# Patient Record
Sex: Female | Born: 1985 | Race: Black or African American | Hispanic: No | Marital: Single | Smoking: Current every day smoker
Health system: Southern US, Community
[De-identification: ages and names within clinical notes are randomized; demographics above are authoritative.]

## PROBLEM LIST (undated history)

## (undated) DIAGNOSIS — L0292 Furuncle, unspecified: Secondary | ICD-10-CM

## (undated) DIAGNOSIS — IMO0002 Reserved for concepts with insufficient information to code with codable children: Secondary | ICD-10-CM

## (undated) DIAGNOSIS — B20 Human immunodeficiency virus [HIV] disease: Secondary | ICD-10-CM

## (undated) DIAGNOSIS — L0293 Carbuncle, unspecified: Secondary | ICD-10-CM

## (undated) HISTORY — DX: Reserved for concepts with insufficient information to code with codable children: IMO0002

## (undated) HISTORY — DX: Carbuncle, unspecified: L02.93

## (undated) HISTORY — DX: Furuncle, unspecified: L02.92

## (undated) HISTORY — PX: ECTOPIC PREGNANCY SURGERY: SHX613

---

## 2005-01-02 ENCOUNTER — Emergency Department (HOSPITAL_COMMUNITY): Admission: EM | Admit: 2005-01-02 | Discharge: 2005-01-02 | Payer: Self-pay | Admitting: Family Medicine

## 2006-07-22 ENCOUNTER — Emergency Department (HOSPITAL_COMMUNITY): Admission: EM | Admit: 2006-07-22 | Discharge: 2006-07-22 | Payer: Self-pay | Admitting: Family Medicine

## 2007-01-29 ENCOUNTER — Emergency Department (HOSPITAL_COMMUNITY): Admission: EM | Admit: 2007-01-29 | Discharge: 2007-01-29 | Payer: Self-pay | Admitting: Family Medicine

## 2009-07-07 ENCOUNTER — Inpatient Hospital Stay (HOSPITAL_COMMUNITY): Admission: AD | Admit: 2009-07-07 | Discharge: 2009-07-07 | Payer: Self-pay | Admitting: Family Medicine

## 2009-07-07 ENCOUNTER — Emergency Department (HOSPITAL_COMMUNITY): Admission: EM | Admit: 2009-07-07 | Discharge: 2009-07-07 | Payer: Self-pay | Admitting: Emergency Medicine

## 2009-07-10 ENCOUNTER — Inpatient Hospital Stay (HOSPITAL_COMMUNITY): Admission: AD | Admit: 2009-07-10 | Discharge: 2009-07-10 | Payer: Self-pay | Admitting: Obstetrics and Gynecology

## 2009-07-13 ENCOUNTER — Inpatient Hospital Stay (HOSPITAL_COMMUNITY): Admission: AD | Admit: 2009-07-13 | Discharge: 2009-07-13 | Payer: Self-pay | Admitting: Obstetrics & Gynecology

## 2009-07-20 ENCOUNTER — Ambulatory Visit (HOSPITAL_COMMUNITY): Admission: AD | Admit: 2009-07-20 | Discharge: 2009-07-20 | Payer: Self-pay | Admitting: Obstetrics & Gynecology

## 2009-07-20 ENCOUNTER — Ambulatory Visit: Payer: Self-pay | Admitting: Obstetrics and Gynecology

## 2009-07-27 ENCOUNTER — Inpatient Hospital Stay (HOSPITAL_COMMUNITY): Admission: AD | Admit: 2009-07-27 | Discharge: 2009-07-27 | Payer: Self-pay | Admitting: Obstetrics & Gynecology

## 2009-08-04 ENCOUNTER — Ambulatory Visit: Payer: Self-pay | Admitting: Obstetrics and Gynecology

## 2009-10-01 HISTORY — PX: COLPOSCOPY: SHX161

## 2009-10-21 ENCOUNTER — Ambulatory Visit: Payer: Self-pay | Admitting: Obstetrics & Gynecology

## 2010-01-18 ENCOUNTER — Ambulatory Visit: Payer: Self-pay | Admitting: Obstetrics and Gynecology

## 2010-04-12 ENCOUNTER — Ambulatory Visit: Payer: Self-pay | Admitting: Obstetrics and Gynecology

## 2010-06-23 ENCOUNTER — Other Ambulatory Visit: Admission: RE | Admit: 2010-06-23 | Discharge: 2010-06-23 | Payer: Self-pay | Admitting: Obstetrics and Gynecology

## 2010-06-23 ENCOUNTER — Ambulatory Visit: Payer: Self-pay | Admitting: Obstetrics and Gynecology

## 2010-06-28 ENCOUNTER — Ambulatory Visit: Payer: Self-pay | Admitting: Obstetrics and Gynecology

## 2010-07-12 ENCOUNTER — Ambulatory Visit: Payer: Self-pay | Admitting: Obstetrics & Gynecology

## 2010-07-26 ENCOUNTER — Ambulatory Visit: Payer: Self-pay | Admitting: Obstetrics and Gynecology

## 2010-07-26 ENCOUNTER — Other Ambulatory Visit: Admission: RE | Admit: 2010-07-26 | Discharge: 2010-07-26 | Payer: Self-pay | Admitting: Obstetrics and Gynecology

## 2010-08-09 ENCOUNTER — Ambulatory Visit: Payer: Self-pay | Admitting: Obstetrics and Gynecology

## 2010-09-13 ENCOUNTER — Ambulatory Visit: Payer: Self-pay | Admitting: Obstetrics and Gynecology

## 2010-09-14 ENCOUNTER — Ambulatory Visit: Payer: Self-pay | Admitting: Obstetrics and Gynecology

## 2010-12-01 ENCOUNTER — Ambulatory Visit: Payer: Self-pay

## 2010-12-08 ENCOUNTER — Ambulatory Visit: Payer: Self-pay

## 2010-12-08 DIAGNOSIS — Z3049 Encounter for surveillance of other contraceptives: Secondary | ICD-10-CM

## 2010-12-13 LAB — POCT PREGNANCY, URINE: Preg Test, Ur: NEGATIVE

## 2010-12-14 LAB — POCT PREGNANCY, URINE: Preg Test, Ur: NEGATIVE

## 2011-01-04 LAB — POCT I-STAT, CHEM 8
BUN: 7 mg/dL (ref 6–23)
Calcium, Ion: 1.15 mmol/L (ref 1.12–1.32)
Chloride: 101 mEq/L (ref 96–112)
Hemoglobin: 12.9 g/dL (ref 12.0–15.0)
Potassium: 3 mEq/L — ABNORMAL LOW (ref 3.5–5.1)
Sodium: 138 mEq/L (ref 135–145)
TCO2: 27 mmol/L (ref 0–100)

## 2011-01-04 LAB — BUN: BUN: 6 mg/dL (ref 6–23)

## 2011-01-04 LAB — DIFFERENTIAL
Basophils Relative: 1 % (ref 0–1)
Eosinophils Absolute: 0.3 10*3/uL (ref 0.0–0.7)
Lymphs Abs: 1.6 10*3/uL (ref 0.7–4.0)
Monocytes Relative: 9 % (ref 3–12)

## 2011-01-04 LAB — HCG, QUANTITATIVE, PREGNANCY
hCG, Beta Chain, Quant, S: 1270 m[IU]/mL — ABNORMAL HIGH (ref <5)
hCG, Beta Chain, Quant, S: 337 m[IU]/mL — ABNORMAL HIGH (ref <5)

## 2011-01-04 LAB — CBC
HCT: 33.1 % — ABNORMAL LOW (ref 36.0–46.0)
Hemoglobin: 10.8 g/dL — ABNORMAL LOW (ref 12.0–15.0)
MCHC: 32.6 g/dL (ref 30.0–36.0)
MCHC: 32.9 g/dL (ref 30.0–36.0)
MCV: 94.7 fL (ref 78.0–100.0)
Platelets: 198 10*3/uL (ref 150–400)
RBC: 3.64 MIL/uL — ABNORMAL LOW (ref 3.87–5.11)
WBC: 3.6 10*3/uL — ABNORMAL LOW (ref 4.0–10.5)
WBC: 4.7 10*3/uL (ref 4.0–10.5)

## 2011-01-04 LAB — POCT URINALYSIS DIP (DEVICE)
Glucose, UA: NEGATIVE mg/dL
Nitrite: NEGATIVE
pH: 7 (ref 5.0–8.0)

## 2011-01-04 LAB — GC/CHLAMYDIA PROBE AMP, GENITAL: GC Probe Amp, Genital: NEGATIVE

## 2011-01-04 LAB — AST: AST: 21 U/L (ref 0–37)

## 2011-01-04 LAB — WET PREP, GENITAL

## 2011-01-04 LAB — ABO/RH: ABO/RH(D): O POS

## 2011-02-07 ENCOUNTER — Ambulatory Visit: Payer: Self-pay | Admitting: Obstetrics and Gynecology

## 2011-02-23 ENCOUNTER — Ambulatory Visit: Payer: Self-pay

## 2011-02-23 ENCOUNTER — Ambulatory Visit: Payer: Self-pay | Admitting: Obstetrics and Gynecology

## 2011-02-23 ENCOUNTER — Other Ambulatory Visit: Payer: Self-pay | Admitting: Obstetrics and Gynecology

## 2011-02-23 DIAGNOSIS — D069 Carcinoma in situ of cervix, unspecified: Secondary | ICD-10-CM

## 2011-02-24 NOTE — Group Therapy Note (Signed)
Tiffany Morrison, Tiffany Morrison             ACCOUNT NO.:  [id number]  MEDICAL RECORD NO.:  [id number]           PATIENT TYPE:  A  LOCATION:  [facility]                   FACILITY:  [facility]  PHYSICIAN:  Caren Griffins, CNM       DATE OF BIRTH:  [birth year]  DATE OF SERVICE:  02/23/2011                                 CLINIC NOTE  REASON FOR VISIT:  Follow-up Pap.  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  She is happy on the Depo-Provera and has scanty menses.  She would like to continue on Depo.  PHYSICAL EXAMINATION:  VITAL SIGNS:  Temperature 100.2, pulse 94, BP 140/90, but recheck is 129/85, weight 150.6, height 5 feet 9 inches. GENERAL:  WN, WD, and NAD. PELVIC:  NEFG other than apparently healed scar from furuncle, right groin urea.  The patient states this got infected due to shaving. Otherwise NEFG.  Vagina is pink, rugated.  Good tone and support. Cervix is clean.  Pap smear done, nulliparous os.  Uterus, NSSP.  No adnexal tenderness or masses.  IMPRESSION AND PLAN:  Borderline blood pressure elevation for patient this age.  She is advised not to shave, but if she does do so in a downward fashion rather than upwards, she will get followup pending the result of this Pap smear done, which should would be back in 2 weeks. She will get another Depo-Provera today.  She is counseled on smoking cessation.          ______________________________ Caren Griffins, CNM    DP/MEDQ  D:  02/23/2011  T:  02/24/2011  Job:  [id number]

## 2011-05-11 ENCOUNTER — Ambulatory Visit (INDEPENDENT_AMBULATORY_CARE_PROVIDER_SITE_OTHER): Payer: Self-pay | Admitting: *Deleted

## 2011-05-11 VITALS — BP 129/84 | HR 71

## 2011-05-11 DIAGNOSIS — Z3049 Encounter for surveillance of other contraceptives: Secondary | ICD-10-CM

## 2011-05-11 MED ORDER — MEDROXYPROGESTERONE ACETATE 150 MG/ML IM SUSP
150.0000 mg | Freq: Once | INTRAMUSCULAR | Status: AC
  Administered 2011-05-11: 150 mg via INTRAMUSCULAR

## 2011-07-27 ENCOUNTER — Ambulatory Visit: Payer: Self-pay

## 2011-07-30 ENCOUNTER — Ambulatory Visit (INDEPENDENT_AMBULATORY_CARE_PROVIDER_SITE_OTHER): Payer: Self-pay | Admitting: *Deleted

## 2011-07-30 VITALS — BP 127/83 | HR 87 | Temp 99.8°F

## 2011-07-30 DIAGNOSIS — Z3049 Encounter for surveillance of other contraceptives: Secondary | ICD-10-CM

## 2011-07-30 DIAGNOSIS — IMO0001 Reserved for inherently not codable concepts without codable children: Secondary | ICD-10-CM

## 2011-07-30 MED ORDER — MEDROXYPROGESTERONE ACETATE 150 MG/ML IM SUSP
150.0000 mg | Freq: Once | INTRAMUSCULAR | Status: AC
  Administered 2011-07-30: 150 mg via INTRAMUSCULAR

## 2011-07-30 NOTE — Progress Notes (Signed)
Has follow up pap scheduled for November

## 2011-08-15 ENCOUNTER — Ambulatory Visit: Payer: Self-pay | Admitting: Obstetrics and Gynecology

## 2011-09-10 ENCOUNTER — Inpatient Hospital Stay (HOSPITAL_COMMUNITY): Admission: RE | Admit: 2011-09-10 | Payer: Self-pay | Source: Ambulatory Visit

## 2011-09-10 ENCOUNTER — Encounter: Payer: Self-pay | Admitting: Physician Assistant

## 2011-09-10 ENCOUNTER — Ambulatory Visit: Payer: Self-pay | Admitting: Physician Assistant

## 2011-09-10 VITALS — BP 137/84 | HR 71 | Temp 97.2°F | Ht 67.0 in | Wt 149.8 lb

## 2011-09-10 DIAGNOSIS — A6000 Herpesviral infection of urogenital system, unspecified: Secondary | ICD-10-CM

## 2011-09-10 DIAGNOSIS — Z01419 Encounter for gynecological examination (general) (routine) without abnormal findings: Secondary | ICD-10-CM

## 2011-09-10 NOTE — Patient Instructions (Addendum)
Pap Test A Pap test is a sampling of cells from a woman's cervix. The cervix is the opening between the vagina (birth canal) and the uterus (the bottom part of the womb). The cells are scraped from the cervix during a pelvic exam. These cells are then looked at under a microscope to see if the cells are normal or to see if a cancer is developing or there are changes that suggest a cancer will develop. Cervical dysplasia is a condition in which a woman has abnormal changes in the top layer of cells of her cervix. These changes are an early sign that cervical cancer may develop. Pap tests also look for the human papilloma virus (HPV) because it has 4 types that are responsible for 70% of cervical cancer. Infections can also be found during a Pap test such as bacteria, fungus, protozoa and viruses.  Cervical cancer is harder to treat and less likely to have a good outcome if left untreated. Catching the disease at an early stage leads to a better outcome. Since the Pap test was introduced 60 years ago, deaths from cervical cancer have decreased by 70%. Every woman should keep up to date with Pap tests. RISK FACTORS FOR CERVICAL CANCER INCLUDE:   Becoming sexually active before age 69.   Being the daughter of a woman who took diethylstilbestrol (DES) during pregnancy.   Having a sexual partner who has or has had cancer of the penis.   Having a sexual partner whose past partner had cervical cancer or cervical dysplasia (early cell changes which suggest a cancer may develop).   Having a weakened immune system. An example would be HIV or other immunodeficiency disorder.   Having had a sexually transmitted infection such as chlamydia, gonorrhea or HPV.   Having had an abnormal Pap or cancer of the vagina or vulva.   Having had more than one sexual partner.   A history of cervical cancer in a woman's sister or mother.   Not using condoms with new sexual partners.   Smoking.  WHO SHOULD HAVE PAP  TESTS  A Pap test is done to screen for cervical cancer.   The first Pap test should be done at age 50.   Between ages 33 and 77, Pap tests are repeated every 2 years.   Beginning at age 75, you are advised to have a Pap test every 3 years as long as your past 3 Pap tests have been normal.   Some women have medical problems that increase the chance of getting cervical cancer. Talk to your caregiver about these problems. It is especially important to talk to your caregiver if a new problem develops soon after your last Pap test. In these cases, your caregiver may recommend more frequent screening and Pap tests.   The above recommendations are the same for women who have or have not gotten the vaccine for HPV (Human Papillomavirus).   If you had a hysterectomy for a problem that was not a cancer or a condition that could lead to cancer, then you no longer need Pap tests. However, even if you no longer need a Pap test, a regular exam is a good idea to make sure no other problems are starting.    If you are between ages 71 and 24, and you have had normal Pap tests going back 10 years, you no longer need Pap tests. However, even if you no longer need a Pap test, a regular exam is a good idea  to make sure no other problems are starting.    If you have had past treatment for cervical cancer or a condition that could lead to cancer, you need Pap tests and screening for cancer for at least 20 years after your treatment.   If Pap tests have been discontinued, risk factors (such as a new sexual partner) need to be re-assessed to determine if screening should be resumed.   Some women may need screenings more often if they are at high risk for cervical cancer.  PREPARATION FOR A PAP TEST A Pap test should be performed during the weeks before the start of menstruation. Women should not douche or have sexual intercourse for 24 hours before the test. No vaginal creams, diaphragms, or tampons should be  used for 24 hours before the test. To minimize discomfort, a woman should empty her bladder just before the exam. TAKING THE PAP TEST The caregiver will perform a pelvic exam. A metal or plastic instrument (speculum) is placed in the vagina. This is done before your caregiver does a bimanual exam of your internal female organs. This instrument allows your caregiver to see the inside of the vagina and look at the cervix. A small, sterile brush is used to take a sample of cells from the internal opening of the cervix. A small wooden spatula is used to scrape the outside of the cervix. Neither of these two methods to collect cells will cause you pain. These two scrapings are placed on a glass slide or in a small bottle filled with a special liquid. The cells are looked at later under a microscope in a lab. A specialist will look at these cells and determine if the cells are normal. RESULTS OF YOUR PAP TEST  A healthy Pap test shows no abnormal cells or evidence of inflammation.   The presence of abnormally growing cells on the surface of the cervix may be reported as an abnormal Pap test. Different categories of findings are used to describe your Pap test. Your caregiver will go over the importance of these findings with you. The caregiver will then determine what follow-up is needed or when you should have your next pap test.   If you have had two or more abnormal Pap tests:   You may be asked to have a colposcopy. This is a test in which the cervix is viewed with a special lighted microscope.   A cervical tissue sample (biopsy) may also be needed. This involves taking a small tissue sample from the cervix. The sample is looked at under a microscope to find the cause of the abnormal cells. Make sure you find out the results of the Pap test. If you have not received the results within two weeks, contact your caregiver's office for the results. Do not assume everything is normal if you have not heard from  your caregiver or medical facility. It is important to follow up on all of your test results.  Document Released: 12/08/2002 Document Revised: 05/30/2011 Document Reviewed: 02/09/2008 [facility] Patient Information 2012 [place], [place].Genital Herpes Genital herpes is a sexually transmitted disease. This means that it is a disease passed by having sex with an infected person. There is no cure for genital herpes. The time between attacks can be months to years. The virus may live in a person but produce no problems (symptoms). This infection can be passed to a baby as it travels down the birth canal (vagina). In a newborn, this can cause central nervous system  damage, eye damage, or even death. The virus that causes genital herpes is usually HSV-2 virus. The virus that causes oral herpes is usually HSV-1. The diagnosis (learning what is wrong) is made through culture results. SYMPTOMS  Usually symptoms of pain and itching begin a few days to a week after contact. It first appears as small blisters that progress to small painful ulcers which then scab over and heal after several days. It affects the outer genitalia, birth canal, cervix, penis, anal area, buttocks, and thighs. HOME CARE INSTRUCTIONS   Keep ulcerated areas dry and clean.   Take medications as directed. Antiviral medications can speed up healing. They will not prevent recurrences or cure this infection. These medications can also be taken for suppression if there are frequent recurrences.   While the infection is active, it is contagious. Avoid all sexual contact during active infections.   Condoms may help prevent spread of the herpes virus.   Practice safe sex.   Wash your hands thoroughly after touching the genital area.   Avoid touching your eyes after touching your genital area.   Inform your caregiver if you have had genital herpes and become pregnant. It is your responsibility to insure a safe outcome for your baby in this  pregnancy.   Only take over-the-counter or prescription medicines for pain, discomfort, or fever as directed by your caregiver.  SEEK MEDICAL CARE IF:   You have a recurrence of this infection.   You do not respond to medications and are not improving.   You have new sources of pain or discharge which have changed from the original infection.   You have an oral temperature above 102 F (38.9 C).   You develop abdominal pain.   You develop eye pain or signs of eye infection.  Document Released: 09/14/2000 Document Revised: 05/30/2011 Document Reviewed: 10/05/2009 [facility] Patient Information 2012 [place], [place].

## 2011-09-11 LAB — HSV(HERPES SMPLX)ABS-I+II(IGG+IGM)-BLD
HSV 1 Glycoprotein G Ab, IgG: <0.1 IV
HSV 2 Glycoprotein G Ab, IgG: 9.59 IV — ABNORMAL HIGH

## 2011-09-13 LAB — HERPES SIMPLEX VIRUS CULTURE: Organism ID, Bacteria: DETECTED

## 2011-09-14 ENCOUNTER — Encounter: Payer: Self-pay | Admitting: Physician Assistant

## 2011-09-14 ENCOUNTER — Telehealth: Payer: Self-pay | Admitting: Physician Assistant

## 2011-09-14 DIAGNOSIS — A6000 Herpesviral infection of urogenital system, unspecified: Secondary | ICD-10-CM | POA: Insufficient documentation

## 2011-09-14 MED ORDER — VALACYCLOVIR HCL 1 G PO TABS: 1000.0000 mg | ORAL_TABLET | Freq: Every day | ORAL | Status: AC

## 2011-09-14 NOTE — Telephone Encounter (Signed)
LM for pt to call to discussed test results. RN to give results and call in RX if I am unavailable when patient calls back.

## 2011-09-17 ENCOUNTER — Telehealth: Payer: Self-pay | Admitting: *Deleted

## 2011-09-17 NOTE — Telephone Encounter (Signed)
Pt left message stating that she had some labs done here last week and is requesting additional tests.

## 2011-09-18 NOTE — Telephone Encounter (Signed)
I attempted to call patient and left her a message that she needs to call us back. Also need to let her know that she is scheduled for a colposcopy on 10/10/10 @ 1:00pm.

## 2011-09-19 NOTE — Telephone Encounter (Signed)
Telephoned pt at home # and advised of Colpo appointment. Pt also wanted to know if was ever tested for HIV. Advised pt did not show of any results for HIV testing.

## 2011-10-04 ENCOUNTER — Ambulatory Visit (INDEPENDENT_AMBULATORY_CARE_PROVIDER_SITE_OTHER): Payer: Self-pay

## 2011-10-04 ENCOUNTER — Ambulatory Visit: Payer: Self-pay

## 2011-10-04 DIAGNOSIS — R87619 Unspecified abnormal cytological findings in specimens from cervix uteri: Secondary | ICD-10-CM

## 2011-10-04 DIAGNOSIS — B20 Human immunodeficiency virus [HIV] disease: Secondary | ICD-10-CM

## 2011-10-05 LAB — URINALYSIS
Hgb urine dipstick: NEGATIVE
Leukocytes, UA: NEGATIVE
Nitrite: NEGATIVE
Specific Gravity, Urine: 1.027 (ref 1.005–1.030)
Urobilinogen, UA: 2 mg/dL — ABNORMAL HIGH (ref 0.0–1.0)

## 2011-10-05 LAB — CBC WITH DIFFERENTIAL/PLATELET
Basophils Relative: 1 % (ref 0–1)
Eosinophils Absolute: 0.8 10*3/uL — ABNORMAL HIGH (ref 0.0–0.7)
Eosinophils Relative: 18 % — ABNORMAL HIGH (ref 0–5)
MCH: 29.8 pg (ref 26.0–34.0)
MCHC: 32.6 g/dL (ref 30.0–36.0)
Neutrophils Relative %: 54 % (ref 43–77)
Platelets: 229 10*3/uL (ref 150–400)

## 2011-10-05 LAB — COMPLETE METABOLIC PANEL WITH GFR
ALT: 11 U/L (ref 0–35)
Alkaline Phosphatase: 45 U/L (ref 39–117)
Creat: 0.76 mg/dL (ref 0.50–1.10)
GFR, Est Non African American: >89 mL/min
Glucose, Bld: 91 mg/dL (ref 70–99)
Sodium: 141 mEq/L (ref 135–145)
Total Bilirubin: 0.6 mg/dL (ref 0.3–1.2)
Total Protein: 8.2 g/dL (ref 6.0–8.3)

## 2011-10-05 LAB — LIPID PANEL: LDL Cholesterol: 153 mg/dL — ABNORMAL HIGH (ref 0–99)

## 2011-10-05 LAB — HEPATITIS A ANTIBODY, TOTAL: Hep A Total Ab: NEGATIVE

## 2011-10-05 LAB — HEPATITIS B SURFACE ANTIBODY,QUALITATIVE: Hep B S Ab: POSITIVE — AB

## 2011-10-05 LAB — HEPATITIS C ANTIBODY: HCV Ab: NEGATIVE

## 2011-10-05 LAB — HEPATITIS B SURFACE ANTIGEN: Hepatitis B Surface Ag: NEGATIVE

## 2011-10-05 LAB — GC/CHLAMYDIA PROBE AMP, URINE: Chlamydia, Swab/Urine, PCR: NEGATIVE

## 2011-10-08 ENCOUNTER — Encounter (HOSPITAL_COMMUNITY): Payer: Self-pay | Admitting: Emergency Medicine

## 2011-10-08 ENCOUNTER — Telehealth: Payer: Self-pay | Admitting: *Deleted

## 2011-10-08 ENCOUNTER — Emergency Department (HOSPITAL_COMMUNITY): Payer: Self-pay

## 2011-10-08 ENCOUNTER — Emergency Department (HOSPITAL_COMMUNITY)
Admission: EM | Admit: 2011-10-08 | Discharge: 2011-10-09 | Disposition: A | Discharge status: Home Health | Payer: Self-pay | Attending: Emergency Medicine | Admitting: Emergency Medicine

## 2011-10-08 DIAGNOSIS — B20 Human immunodeficiency virus [HIV] disease: Secondary | ICD-10-CM

## 2011-10-08 DIAGNOSIS — Z21 Asymptomatic human immunodeficiency virus [HIV] infection status: Secondary | ICD-10-CM | POA: Insufficient documentation

## 2011-10-08 DIAGNOSIS — F17200 Nicotine dependence, unspecified, uncomplicated: Secondary | ICD-10-CM | POA: Insufficient documentation

## 2011-10-08 DIAGNOSIS — R0602 Shortness of breath: Secondary | ICD-10-CM | POA: Insufficient documentation

## 2011-10-08 DIAGNOSIS — R071 Chest pain on breathing: Secondary | ICD-10-CM | POA: Insufficient documentation

## 2011-10-08 DIAGNOSIS — R091 Pleurisy: Secondary | ICD-10-CM | POA: Insufficient documentation

## 2011-10-08 DIAGNOSIS — Z86711 Personal history of pulmonary embolism: Secondary | ICD-10-CM | POA: Insufficient documentation

## 2011-10-08 DIAGNOSIS — R0682 Tachypnea, not elsewhere classified: Secondary | ICD-10-CM | POA: Insufficient documentation

## 2011-10-08 HISTORY — DX: Human immunodeficiency virus (HIV) disease: B20

## 2011-10-08 LAB — TB SKIN TEST
Induration: 0
TB Skin Test: NEGATIVE mm

## 2011-10-08 LAB — HIV-1 RNA ULTRAQUANT REFLEX TO GENTYP+: HIV 1 RNA Quant: 249861 copies/mL — ABNORMAL HIGH (ref <20)

## 2011-10-08 MED ORDER — LORAZEPAM 2 MG/ML IJ SOLN
0.5000 mg | Freq: Once | INTRAMUSCULAR | Status: AC
  Administered 2011-10-08: 0.5 mg via INTRAVENOUS
  Filled 2011-10-08: qty 1

## 2011-10-08 MED ORDER — KETOROLAC TROMETHAMINE 30 MG/ML IJ SOLN
30.0000 mg | Freq: Once | INTRAMUSCULAR | Status: AC
  Administered 2011-10-08: 30 mg via INTRAVENOUS
  Filled 2011-10-08: qty 1

## 2011-10-08 NOTE — ED Notes (Signed)
Attempted to collect a urine sample. Patient stated that she was unable to urinate at this time.

## 2011-10-08 NOTE — ED Provider Notes (Signed)
History     CSN: [id number]  Arrival date & time 10/08/11  2135   First MD Initiated Contact with Patient 10/08/11 2307      Chief Complaint  Patient presents with  . Shortness of Breath    chest soreness with deep breathing    (Consider location/radiation/quality/duration/timing/severity/associated sxs/prior treatment) HPI Comments: Patient presents with 2 days of shortness of breath and pleuritic chest pain. Pain started in the left side of her chest yesterday she become more short of breath today feeling like she can't walk a long distance. Breathing worsens if he lays flat. She denies any cough or fever. Aching in short sentences and feels that she can't get enough air. She is a recent diagnosis of HIV and has not started any medications yet review of laboratories that her CD4 is 9, she follows up with the ID clinic on January 17.  She denies any leg pain or swelling. She is on Depo-Provera birth control.  The history is provided by the patient.    Past Medical History  Diagnosis Date  . Recurrent boils   . Abnormal Pap smear and cervical HPV (human papillomavirus) 2011    colposcopy   . Genital herpes 09/14/2011  . HIV disease     Past Surgical History  Procedure Date  . Colposcopy 2011  . Ectopic pregnancy surgery 2010, 2007    History reviewed. No pertinent family history.  History  Substance Use Topics  . Smoking status: Current Everyday Smoker -- 0.2 packs/day    Types: Cigarettes  . Smokeless tobacco: Never Used  . Alcohol Use: 0.6 oz/week    1 Glasses of wine per week    OB History    Grav Para Term Preterm Abortions TAB SAB Ect Mult Living   2 0 0 0 2 2 0 0 0 0       Review of Systems  Constitutional: Positive for activity change. Negative for appetite change.  HENT: Negative for congestion and rhinorrhea.   Respiratory: Positive for shortness of breath. Negative for cough.   Cardiovascular: Positive for chest pain.  Gastrointestinal: Negative for  nausea, vomiting and abdominal pain.  Genitourinary: Negative for dysuria and hematuria.  Musculoskeletal: Negative for back pain.  Skin: Negative for rash.  Neurological: Negative for dizziness and weakness.    Allergies  Review of patient's allergies indicates no known allergies.  Home Medications   Current Outpatient Rx  Name Route Sig Dispense Refill  . ACYCLOVIR PO Oral Take 1 capsule by mouth 3 (three) times daily. Dose unknown & [facility] pharmacy is closed. RX capture did not have any medications listed.     Marland Kitchen MEDROXYPROGESTERONE ACETATE 150 MG/ML IM SUSP Intramuscular Inject 150 mg into the muscle every 3 (three) months.      Marland Kitchen HYDROCODONE-ACETAMINOPHEN 5-325 MG PO TABS Oral Take 2 tablets by mouth every 4 (four) hours as needed for pain. 10 tablet 0  . IBUPROFEN 800 MG PO TABS Oral Take 1 tablet (800 mg total) by mouth 3 (three) times daily. 21 tablet 0    BP 103/64  Pulse 77  Temp(Src) 98.8 F (37.1 C) (Oral)  Resp 20  SpO2 100%  Physical Exam  Constitutional: She is oriented to person, place, and time. She appears well-developed and well-nourished. She appears distressed.       Shallow breathing and tachypnea  HENT:  Head: Normocephalic and atraumatic.  Mouth/Throat: Oropharynx is clear and moist. No oropharyngeal exudate.  Eyes: Conjunctivae are normal. Pupils are equal, round,  and reactive to light.  Neck: Normal range of motion. Neck supple.  Cardiovascular: Normal rate, regular rhythm and normal heart sounds.   Pulmonary/Chest: Effort normal and breath sounds normal. She exhibits tenderness.  Abdominal: Soft. There is no tenderness. There is no rebound and no guarding.  Musculoskeletal: Normal range of motion. She exhibits no edema and no tenderness.        no calf tenderness, cords, asymmetry  Neurological: She is alert and oriented to person, place, and time.  Skin: Skin is warm and dry.    ED Course  Procedures (including critical care time)  Labs  Reviewed  CBC - Abnormal; Notable for the following:    RBC 3.37 (*)    Hemoglobin 10.0 (*)    HCT 30.0 (*)    All other components within normal limits  DIFFERENTIAL - Abnormal; Notable for the following:    Eosinophils Relative 14 (*)    All other components within normal limits  BASIC METABOLIC PANEL - Abnormal; Notable for the following:    Potassium 3.1 (*)    All other components within normal limits  URINALYSIS, ROUTINE W REFLEX MICROSCOPIC - Abnormal; Notable for the following:    APPearance CLOUDY (*)    Specific Gravity, Urine 1.035 (*)    pH 8.5 (*)    Protein, ur 30 (*)    All other components within normal limits  PROTIME-INR - Abnormal; Notable for the following:    Prothrombin Time 15.6 (*)    All other components within normal limits  URINE MICROSCOPIC-ADD ON   Ct Angio Chest W/cm &/or Wo Cm  10/09/2011  *RADIOLOGY REPORT*  Clinical Data:  Chest pain.  Pulmonary embolism.  CT ANGIOGRAPHY CHEST WITH CONTRAST  Technique:  Multidetector CT imaging of the chest was performed using the standard protocol during bolus administration of intravenous contrast.  Multiplanar CT image reconstructions including MIPs were obtained to evaluate the vascular anatomy.  Contrast: 80mL OMNIPAQUE IOHEXOL 350 MG/ML IV SOLN  Comparison:  10/18/2011.  Findings:  Technically adequate study.  Negative for pulmonary embolus.  Heart grossly appears within normal limits.  Anterior mediastinal thymic tissue is present.  No axillary, mediastinal, or hilar adenopathy.  No pericardial or pleural effusion.  Incidental imaging the upper abdomen appears normal.  The lungs appear clear.  Bones appear within normal limits.  Review of the MIP images confirms the above findings.  IMPRESSION: Negative CTA chest.  Original Report Authenticated By: Andreas Newport, M.D.   Dg Chest Port 1v Same Day  10/08/2011  *RADIOLOGY REPORT*  Clinical Data: Short of breath.  Chest pain.  PORTABLE CHEST - 1 VIEW SAME DAY  Comparison:  None.  Findings:  Cardiopericardial silhouette within normal limits. Mediastinal contours normal. Trachea midline.  No airspace disease or effusion.  IMPRESSION: No active cardiopulmonary disease.  Original Report Authenticated By: Andreas Newport, M.D.     1. Pleurisy   2. HIV (human immunodeficiency virus infection)       MDM  Shortness of breath, pleuritic chest pain, birth control use and recent HIV diagnosis.  O2 saturation is 100% on room air, lung sounds equal bilaterally.  We'll check x-ray, EKG, CT scan showed chest to rule out pulmonary embolism  CTPE negative.  No other pathology on lungs.   Patient ambulated around the department maintaining oxygen saturations greater than 100%.  Pain improved with medications.  Will treat for pleurisy.  No evidence of infection as source of symptoms. F/u with ID as scheduled.  Date: 10/08/2011  Rate: 76  Rhythm: normal sinus rhythm  QRS Axis: normal  Intervals: normal  ST/T Wave abnormalities: normal  Conduction Disutrbances:none  Narrative Interpretation:   Old EKG Reviewed: none available    Glynn Octave, MD 10/09/11 [phone]

## 2011-10-08 NOTE — Telephone Encounter (Signed)
PPD negative

## 2011-10-08 NOTE — ED Notes (Signed)
Pt reports chest soreness and pain with deep breathing that increased with lying in bed also c/o SOB x2 days RA sat 100% at rest  In ED but pt reports ambulating sats decrease to 80's %

## 2011-10-08 NOTE — ED Notes (Signed)
Pt friends cell phone for updates [phone] and sister  Cell [phone].

## 2011-10-09 ENCOUNTER — Emergency Department (HOSPITAL_COMMUNITY): Payer: Self-pay

## 2011-10-09 DIAGNOSIS — B20 Human immunodeficiency virus [HIV] disease: Secondary | ICD-10-CM | POA: Insufficient documentation

## 2011-10-09 DIAGNOSIS — R87619 Unspecified abnormal cytological findings in specimens from cervix uteri: Secondary | ICD-10-CM | POA: Insufficient documentation

## 2011-10-09 LAB — URINE MICROSCOPIC-ADD ON

## 2011-10-09 LAB — CBC
HCT: 30 % — ABNORMAL LOW (ref 36.0–46.0)
Hemoglobin: 10 g/dL — ABNORMAL LOW (ref 12.0–15.0)
MCHC: 33.3 g/dL (ref 30.0–36.0)
RBC: 3.37 MIL/uL — ABNORMAL LOW (ref 3.87–5.11)
WBC: 4.8 10*3/uL (ref 4.0–10.5)

## 2011-10-09 LAB — DIFFERENTIAL
Lymphocytes Relative: 21 % (ref 12–46)
Lymphs Abs: 1 10*3/uL (ref 0.7–4.0)
Monocytes Absolute: 0.3 10*3/uL (ref 0.1–1.0)
Monocytes Relative: 7 % (ref 3–12)
Neutro Abs: 2.8 10*3/uL (ref 1.7–7.7)
Neutrophils Relative %: 58 % (ref 43–77)

## 2011-10-09 LAB — URINALYSIS, ROUTINE W REFLEX MICROSCOPIC
Bilirubin Urine: NEGATIVE
Glucose, UA: NEGATIVE mg/dL
Ketones, ur: NEGATIVE mg/dL
Leukocytes, UA: NEGATIVE
Nitrite: NEGATIVE
Specific Gravity, Urine: 1.035 — ABNORMAL HIGH (ref 1.005–1.030)
pH: 8.5 — ABNORMAL HIGH (ref 5.0–8.0)

## 2011-10-09 LAB — BASIC METABOLIC PANEL
BUN: 10 mg/dL (ref 6–23)
CO2: 28 mEq/L (ref 19–32)
Chloride: 105 mEq/L (ref 96–112)
Creatinine, Ser: 0.75 mg/dL (ref 0.50–1.10)
Glucose, Bld: 91 mg/dL (ref 70–99)
Potassium: 3.1 mEq/L — ABNORMAL LOW (ref 3.5–5.1)

## 2011-10-09 LAB — PROTIME-INR: INR: 1.21 (ref 0.00–1.49)

## 2011-10-09 MED ORDER — HYDROCODONE-ACETAMINOPHEN 5-325 MG PO TABS: 2.0000 | ORAL_TABLET | ORAL | Status: AC | PRN

## 2011-10-09 MED ORDER — IBUPROFEN 800 MG PO TABS: 800.0000 mg | ORAL_TABLET | Freq: Three times a day (TID) | ORAL | Status: AC

## 2011-10-09 MED ORDER — IOHEXOL 350 MG/ML SOLN
80.0000 mL | Freq: Once | INTRAVENOUS | Status: AC | PRN
  Administered 2011-10-09: 80 mL via INTRAVENOUS

## 2011-10-09 NOTE — ED Notes (Signed)
Patient returned from CT

## 2011-10-09 NOTE — Progress Notes (Signed)
Pt is very surprised about her HIV diagnosis even with her past history of multiple partners.  She has been with one partner for the last 5 years and he has tested negative. She is having problems understanding why he is not positive.  He has been to four separate facilities for HIV test and they were all negative.  While in this relationship they have never used condoms.  Pt is very distraught and is crying throughout the intake.  Her sister is present and seems to offer comfort with her presence.    Pt admits her more than 300 sexual partners was in her "wild days" and she was not worried about contracting any sexual diseases.  She is currently at student at [employer] and non employed. Pt was taken to [facility] for financial assistance.   We are out of pneumonia vaccine, pt has a fear of needles.  I will defer Pneumonia and Flu vaccine until OV with Dr Drue Second.  Laurell Josephs, RN

## 2011-10-09 NOTE — ED Notes (Signed)
Patient transported to X-ray 

## 2011-10-09 NOTE — ED Notes (Signed)
PT ambulated with a steady gait; VSS: no signs of distress; pt reported she had no questions; respirations even and unlabored; skin warm and dry.

## 2011-10-10 ENCOUNTER — Telehealth: Payer: Self-pay | Admitting: *Deleted

## 2011-10-10 NOTE — Telephone Encounter (Signed)
Pt left message stating that she would like to talk with the doctor that is going to do her colpo tomorrow. She was told that she might not need it. I returned pt call and discussed her concerns. She states that she recently found out that she has +HIV. She was told @ her infectious disease clinic appt that she might not need the colpo because HIV+ pts will often have an abnormal Pap.  I explained to the pt that in addition to having abnormal cells on the Pap, she also had +HPV detected.  I explained what HPV is and that she would still need the colpo. Pt voiced understanding and will keep appt as scheduled tomorrow.

## 2011-10-11 ENCOUNTER — Encounter: Payer: Self-pay | Admitting: Physician Assistant

## 2011-10-15 ENCOUNTER — Ambulatory Visit: Payer: Self-pay

## 2011-10-18 ENCOUNTER — Ambulatory Visit: Payer: Self-pay

## 2011-10-18 ENCOUNTER — Ambulatory Visit (INDEPENDENT_AMBULATORY_CARE_PROVIDER_SITE_OTHER): Payer: Self-pay | Admitting: Internal Medicine

## 2011-10-18 ENCOUNTER — Encounter: Payer: Self-pay | Admitting: Internal Medicine

## 2011-10-18 ENCOUNTER — Encounter: Payer: Self-pay | Admitting: *Deleted

## 2011-10-18 VITALS — BP 136/93 | HR 86 | Temp 99.0°F | Ht 67.0 in | Wt 153.0 lb

## 2011-10-18 DIAGNOSIS — Z21 Asymptomatic human immunodeficiency virus [HIV] infection status: Secondary | ICD-10-CM

## 2011-10-18 DIAGNOSIS — Z23 Encounter for immunization: Secondary | ICD-10-CM

## 2011-10-18 DIAGNOSIS — B20 Human immunodeficiency virus [HIV] disease: Secondary | ICD-10-CM

## 2011-10-18 DIAGNOSIS — B009 Herpesviral infection, unspecified: Secondary | ICD-10-CM

## 2011-10-18 LAB — HIV-1 GENOTYPR PLUS

## 2011-10-18 MED ORDER — EMTRICITAB-RILPIVIR-TENOFOV DF 200-25-300 MG PO TABS: 1.0000 | ORAL_TABLET | Freq: Every day | ORAL | Status: DC

## 2011-10-18 MED ORDER — VALACYCLOVIR HCL 500 MG PO TABS: 500.0000 mg | ORAL_TABLET | Freq: Two times a day (BID) | ORAL | Status: DC

## 2011-10-18 MED ORDER — SULFAMETHOXAZOLE-TRIMETHOPRIM 800-160 MG PO TABS: 1.0000 | ORAL_TABLET | Freq: Every day | ORAL | Status: DC

## 2011-10-18 MED ORDER — AZITHROMYCIN 600 MG PO TABS: 1200.0000 mg | ORAL_TABLET | ORAL | Status: DC

## 2011-10-18 NOTE — Progress Notes (Signed)
HIV CLINIC INITIAL VISIT  RFV: initial visit to initiate ART  Subjective:    Patient ID: Tiffany Morrison, female    DOB: 24-May-1986, 26 y.o.   MRN: [id number]  HPI 26 yo AAF with newly diagnosed HIV, CD 4 count of 40 (5%)/ VL [id number]. ART naive, genotype negative mutations. She was recently diagnosed in setting of having HSV 2 genital herpes and abnormal pap + HPV. RF for HIV inc. Unprotected sexual intercourse. This is the first time she has tested positive for STI and first time tested for HIV. She is currently in monogamous relationship for the past 5 yrs with her boyfriend, who has tested negative. Since being diagnosed, she has been tearful, depressed, but also glad that her partner is not HIV positive as well. She is tearful during this visit. She is interested initiating treatment for HIV.  Gyn hx =  - ectopic, tubal pregnancy s/p laprascopic surgery and removal of right fallopian tube in 2006,  - 2nd ectopic pregnancy in 2008-09.  - Abnormal pap s/p colpolscopy 2011  Active Ambulatory Problems    Diagnosis Date Noted  . Genital herpes 09/14/2011  . Abnormal Pap smear of cervix 10/09/2011  . HIV infection 10/09/2011   Resolved Ambulatory Problems    Diagnosis Date Noted  . No Resolved Ambulatory Problems   Past Medical History  Diagnosis Date  . Recurrent boils   . Abnormal Pap smear and cervical HPV (human papillomavirus) 2011  . HIV disease   allergies: No Known Allergies   Prior to Admission medications   Medication Sig Start Date End Date Taking? Authorizing Provider  ACYCLOVIR PO Take 1 capsule by mouth 3 (three) times daily. Dose unknown & [facility] is closed. RX capture did not have any medications listed.     Historical Provider, MD  azithromycin (ZITHROMAX) 600 MG tablet Take 2 tablets (1,200 mg total) by mouth every 7 (seven) days. 10/18/11 11/17/11  Judyann Munson, MD  Emtricitab-Rilpivir-Tenofovir (COMPLERA) 200-25-300 MG TABS Take 1 tablet by mouth daily.  10/18/11   Judyann Munson, MD  HYDROcodone-acetaminophen (NORCO) 5-325 MG per tablet Take 2 tablets by mouth every 4 (four) hours as needed for pain. 10/09/11 10/19/11  Glynn Octave, MD  ibuprofen (ADVIL,MOTRIN) 800 MG tablet Take 1 tablet (800 mg total) by mouth 3 (three) times daily. 10/09/11 10/19/11  Glynn Octave, MD  sulfamethoxazole-trimethoprim (BACTRIM DS) 800-160 MG per tablet Take 1 tablet by mouth daily. 10/18/11 10/21/11  Judyann Munson, MD  valACYclovir (VALTREX) 500 MG tablet Take 1 tablet (500 mg total) by mouth 2 (two) times daily. 10/18/11 10/23/11  Judyann Munson, MD     SHx: originally from [place], has lived in [place] for the last 6-7 years. 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 She smokes 1-3 cig/per day. Occasional wine and marijuana use. No other illicit drug use. She is in monogomous relationship. Previously had unprotected sex with her current partner, now they are abstaining at present. Uses depo for birth control.  FHx : DM  Review of Systems  Constitutional: Negative for fever, chills, diaphoresis, activity change, appetite change, fatigue and unexpected weight change.  HENT: Negative for congestion, sore throat, rhinorrhea, sneezing, trouble swallowing and sinus pressure.  Eyes: Negative for photophobia and visual disturbance.  Respiratory: Negative for cough, chest tightness, shortness of breath, wheezing and stridor.  Cardiovascular: Negative for chest pain, palpitations and leg swelling.  Gastrointestinal: Negative for nausea, vomiting, abdominal pain,  diarrhea, constipation, blood in stool, abdominal distention and anal bleeding.  Genitourinary: Negative for dysuria, hematuria, flank pain and difficulty urinating.  Musculoskeletal: Negative for myalgias, back pain, joint swelling, arthralgias and gait problem.  Skin: Negative for color change, pallor, rash and wound.  Neurological:  Negative for dizziness, tremors, weakness and light-headedness.  Hematological: Negative for adenopathy. Does not bruise/bleed easily.  Psychiatric/Behavioral: Negative for behavioral problems, confusion, sleep disturbance, dysphoric mood, decreased concentration and agitation.       Objective:   Physical Exam BP 136/93  Pulse 86  Temp(Src) 99 F (37.2 C) (Oral)  Ht 5\' 7"  (1.702 m)  Wt 153 lb (69.4 kg)  BMI 23.96 kg/m2  LMP 09/17/2011  General Appearance:    Alert, cooperative, no distress, appears stated age  Head:    Normocephalic, without obvious abnormality, atraumatic  Eyes:    PERRL, conjunctiva/corneas clear, EOM's intact, fundi    benign, both eyes  Ears:    Normal TM's and external ear canals, both ears  Nose:   Nares normal, septum midline, mucosa normal, no drainage    or sinus tenderness  Throat:   Lips, mucosa, and tongue normal; teeth and gums normal  Neck:   Supple, symmetrical, trachea midline, no adenopathy;    thyroid:  no enlargement/tenderness/nodules; no carotid   bruit or JVD     Lungs:     Clear to auscultation bilaterally, respirations unlabored      Heart:    Regular rate and rhythm, S1 and S2 normal, no murmur, rub   or gallop     Abdomen:     Soft, non-tender, bowel sounds active all four quadrants,    no masses, no organomegaly        Extremities:   Extremities normal, atraumatic, no cyanosis or edema  Pulses:   2+ and symmetric all extremities  Skin:   Skin color, texture, turgor normal, no rashes or lesions  Lymph nodes:   Shotty bilateral Cervical LAD.  supraclavicular, and axillary nodes normal  Neurologic:   CNII-XII intact, normal strength, sensation and reflexes    throughout  Labs= cd4 count 40 Viral load 249,861 HIV genotype - no mutations HepBsAb + Hep A - Hep C - HSV + RPR - GC/Chlam - PPD -     Assessment & Plan:  26 yo F with newly diagnosed HIV/AIDS with CD4 count of 40, VL 249,861  HIV = will start patient on  complera, 1 tablet daily in addition to OI prophylaxis with azithromycin 1200mg  Qwk and bactrim DS daily. She is the process of applying for ADAP to get medications  since she does not presently have health insurance  HSV-2 suppression = to minimize pill burden, will switch her to valtrex 500mg  BID once she gets adap approval, and then she can stop taking acyclovir  Abn pap 2/2 HPV = recommend to follow up with [facility] clinic for colpolscopy. Also should see [facility] case management to get discount in fees.  Health maintenance = will give pneumococcal and influenza vaccination at this visit  Birth control = will offer depo here at next visit  hiv prevention = she will need to use condoms with partner to prevent transmission onto him.  Coping with diagnoses = have offered counseling service available at clinic.  rtc in 1 month to repeat blood work.

## 2011-10-29 ENCOUNTER — Other Ambulatory Visit: Payer: Self-pay | Admitting: Licensed Clinical Social Worker

## 2011-10-29 DIAGNOSIS — B20 Human immunodeficiency virus [HIV] disease: Secondary | ICD-10-CM

## 2011-10-29 MED ORDER — EMTRICITAB-RILPIVIR-TENOFOV DF 200-25-300 MG PO TABS: 1.0000 | ORAL_TABLET | Freq: Every day | ORAL | Status: DC

## 2011-10-29 MED ORDER — AZITHROMYCIN 600 MG PO TABS: 1200.0000 mg | ORAL_TABLET | ORAL | Status: DC

## 2011-11-01 ENCOUNTER — Other Ambulatory Visit: Payer: Self-pay | Admitting: *Deleted

## 2011-11-01 ENCOUNTER — Telehealth: Payer: Self-pay | Admitting: *Deleted

## 2011-11-01 DIAGNOSIS — B20 Human immunodeficiency virus [HIV] disease: Secondary | ICD-10-CM

## 2011-11-01 DIAGNOSIS — B009 Herpesviral infection, unspecified: Secondary | ICD-10-CM

## 2011-11-01 MED ORDER — VALACYCLOVIR HCL 500 MG PO TABS: 500.0000 mg | ORAL_TABLET | Freq: Two times a day (BID) | ORAL | Status: AC

## 2011-11-01 MED ORDER — SULFAMETHOXAZOLE-TRIMETHOPRIM 800-160 MG PO TABS: 1.0000 | ORAL_TABLET | Freq: Every day | ORAL | Status: AC

## 2011-11-01 MED ORDER — AZITHROMYCIN 600 MG PO TABS: 1200.0000 mg | ORAL_TABLET | ORAL | Status: AC

## 2011-11-01 MED ORDER — EMTRICITAB-RILPIVIR-TENOFOV DF 200-25-300 MG PO TABS: 1.0000 | ORAL_TABLET | Freq: Every day | ORAL | Status: DC

## 2011-11-01 NOTE — Telephone Encounter (Signed)
[facility] called to make sure she had the correct meds & doses. I gave them to her from the last OV note

## 2011-12-04 ENCOUNTER — Other Ambulatory Visit: Payer: Self-pay

## 2011-12-04 ENCOUNTER — Other Ambulatory Visit (INDEPENDENT_AMBULATORY_CARE_PROVIDER_SITE_OTHER): Payer: Self-pay

## 2011-12-04 ENCOUNTER — Ambulatory Visit: Payer: Self-pay | Admitting: Internal Medicine

## 2011-12-04 DIAGNOSIS — B20 Human immunodeficiency virus [HIV] disease: Secondary | ICD-10-CM

## 2011-12-04 LAB — COMPLETE METABOLIC PANEL WITH GFR
BUN: 9 mg/dL (ref 6–23)
CO2: 26 mEq/L (ref 19–32)
Calcium: 9.2 mg/dL (ref 8.4–10.5)
Chloride: 101 mEq/L (ref 96–112)
Creat: 0.73 mg/dL (ref 0.50–1.10)
GFR, Est African American: >89 mL/min
GFR, Est Non African American: >89 mL/min
Glucose, Bld: 68 mg/dL — ABNORMAL LOW (ref 70–99)
Total Bilirubin: 0.4 mg/dL (ref 0.3–1.2)

## 2011-12-05 LAB — CBC WITH DIFFERENTIAL/PLATELET
Basophils Absolute: 0 10*3/uL (ref 0.0–0.1)
Basophils Relative: 1 % (ref 0–1)
Eosinophils Absolute: 0.7 10*3/uL (ref 0.0–0.7)
HCT: 33.1 % — ABNORMAL LOW (ref 36.0–46.0)
Hemoglobin: 11 g/dL — ABNORMAL LOW (ref 12.0–15.0)
MCH: 30.8 pg (ref 26.0–34.0)
MCHC: 33.2 g/dL (ref 30.0–36.0)
Monocytes Absolute: 0.3 10*3/uL (ref 0.1–1.0)
Monocytes Relative: 8 % (ref 3–12)
Neutro Abs: 1.5 10*3/uL — ABNORMAL LOW (ref 1.7–7.7)
RDW: 14.6 % (ref 11.5–15.5)

## 2011-12-05 LAB — T-HELPER CELL (CD4) - (RCID CLINIC ONLY)
CD4 % Helper T Cell: 9 % — ABNORMAL LOW (ref 33–55)
CD4 T Cell Abs: 130 uL — ABNORMAL LOW (ref 400–2700)

## 2011-12-06 ENCOUNTER — Encounter: Payer: Self-pay | Admitting: Physician Assistant

## 2011-12-25 ENCOUNTER — Ambulatory Visit (INDEPENDENT_AMBULATORY_CARE_PROVIDER_SITE_OTHER): Payer: Self-pay | Admitting: Internal Medicine

## 2011-12-25 ENCOUNTER — Ambulatory Visit: Payer: Self-pay | Admitting: Internal Medicine

## 2011-12-25 ENCOUNTER — Encounter: Payer: Self-pay | Admitting: Internal Medicine

## 2011-12-25 DIAGNOSIS — B20 Human immunodeficiency virus [HIV] disease: Secondary | ICD-10-CM

## 2011-12-25 DIAGNOSIS — B079 Viral wart, unspecified: Secondary | ICD-10-CM | POA: Insufficient documentation

## 2011-12-25 NOTE — Progress Notes (Signed)
HIV CLINIC VISIT  RFV: labs since starting ART x 1 month Subjective:    Patient ID: Tiffany Morrison, female    DOB: 1986-05-10, 26 y.o.   MRN: [id number]  HPI Tiffany Morrison is  25yo F with HIV/AIDS, CD 4 count 130(9%)/VL 521 in 12/04/11, on complera. In addition she is on bactrim and azithromycin since nadir CD 4 count of 40 in Jan 2013. Reports 100% adherence with meds. Doing well without side effects. Taking meds on a heavy meal. She has noticed that she has had warts starting to erupt on her lower right corner of lip. She initially had them 4 yrs ago, mostly flat, but over the last 3 wks, they are significantly pronounced. She denies f/chills/nightsweats/ n/v/diarrhea/no other rash. She also has her colpolscopy scheduled for April 4th. She states that she is handling her HIV diagnosis better. She is less emotionally labile. She feels that her partner/boyfriend is also becoming more supportive.  Prior to Admission medications   Medication Sig Start Date End Date Taking? Authorizing Provider  ACYCLOVIR PO Take 1 capsule by mouth 3 (three) times daily. Dose unknown & [facility] is closed. RX capture did not have any medications listed.    Yes Historical Provider, MD  Emtricitab-Rilpivir-Tenofovir (COMPLERA) 200-25-300 MG TABS Take 1 tablet by mouth daily. 11/01/11  Yes Judyann Munson, MD      Review of Systems  Constitutional: Negative for fever, chills, diaphoresis, activity change, appetite change, fatigue and unexpected weight change.  HENT: Negative for congestion, sore throat, rhinorrhea, sneezing, trouble swallowing and sinus pressure.  Eyes: Negative for photophobia and visual disturbance.  Respiratory: Negative for cough, chest tightness, shortness of breath, wheezing and stridor.  Cardiovascular: Negative for chest pain, palpitations and leg swelling.  Gastrointestinal: Negative for nausea, vomiting, abdominal pain, diarrhea, constipation, blood in stool, abdominal distention and anal  bleeding.  Genitourinary: Negative for dysuria, hematuria, flank pain and difficulty urinating.  Musculoskeletal: Negative for myalgias, back pain, joint swelling, arthralgias and gait problem.  Skin: positive for skin lesion to right corner lip.Negative for color change, pallor, and wound.  Neurological: Negative for dizziness, tremors, weakness and light-headedness.  Hematological: Negative for adenopathy. Does not bruise/bleed easily.  Psychiatric/Behavioral: Negative for behavioral problems, confusion, sleep disturbance, dysphoric mood, decreased concentration and agitation.      Objective:   Physical Exam BP 163/94  Pulse 86  Temp(Src) 98.2 F (36.8 C) (Oral)  Wt 152 lb (68.947 kg)  General Appearance:    Alert, cooperative, no distress, appears stated age  Head:    Normocephalic, without obvious abnormality, atraumatic  Eyes:    PERRL, conjunctiva/corneas clear, EOM's intact, fundi    benign, both eyes  Ears:    Normal TM's and external ear canals, both ears  Nose:   Nares normal, septum midline, mucosa normal, no drainage    or sinus tenderness  Throat:   Right corner lip has 3 prominent warts lower labia, mucosa, and tongue normal; teeth and gums normal  Neck:   Supple, symmetrical, trachea midline, no adenopathy;         Lungs:     Clear to auscultation bilaterally, respirations unlabored      Heart:    Regular rate and rhythm, S1 and S2 normal, no murmur, rub   or gallop     Abdomen:     Soft, non-tender, bowel sounds active all four quadrants,    no masses, no organomegaly        Extremities:   Extremities normal,  atraumatic, no cyanosis or edema  Pulses:   2+ and symmetric all extremities  Skin:   Skin color, texture, turgor normal, no rashes or lesions  Lymph nodes:   Cervical, supraclavicular, and axillary nodes normal         Assessment & Plan:   HIV = continue with complera. Patient is having excellent response to treatment. We will have her come back mid  may and repeat labs, cd 4 count / VL to see how she is doing  Labial warts = likely HPV, patient is going to derm to evaluate and treat  Abn pap 2/2 HPV = patient has copolscopy scheduled early April   OI proph = will continue with daily bactrim and weekly azithromycin  rtc mid may

## 2012-01-03 ENCOUNTER — Ambulatory Visit (INDEPENDENT_AMBULATORY_CARE_PROVIDER_SITE_OTHER): Payer: Self-pay | Admitting: Physician Assistant

## 2012-01-03 ENCOUNTER — Encounter: Payer: Self-pay | Admitting: Physician Assistant

## 2012-01-03 ENCOUNTER — Other Ambulatory Visit (HOSPITAL_COMMUNITY)
Admission: RE | Admit: 2012-01-03 | Discharge: 2012-01-03 | Disposition: A | Discharge status: Home / Self Care | Payer: Self-pay | Source: Ambulatory Visit | Attending: Obstetrics & Gynecology | Admitting: Obstetrics & Gynecology

## 2012-01-03 VITALS — BP 141/90 | HR 73 | Temp 98.4°F | Ht 69.0 in | Wt 153.5 lb

## 2012-01-03 DIAGNOSIS — R87811 Vaginal high risk human papillomavirus (HPV) DNA test positive: Secondary | ICD-10-CM

## 2012-01-03 DIAGNOSIS — IMO0002 Reserved for concepts with insufficient information to code with codable children: Secondary | ICD-10-CM

## 2012-01-03 DIAGNOSIS — Z01812 Encounter for preprocedural laboratory examination: Secondary | ICD-10-CM

## 2012-01-03 DIAGNOSIS — R87619 Unspecified abnormal cytological findings in specimens from cervix uteri: Secondary | ICD-10-CM | POA: Insufficient documentation

## 2012-01-03 LAB — POCT URINALYSIS DIP (DEVICE)
Glucose, UA: NEGATIVE mg/dL
Leukocytes, UA: NEGATIVE
Nitrite: NEGATIVE
Protein, ur: NEGATIVE mg/dL
Specific Gravity, Urine: 1.02 (ref 1.005–1.030)
Urobilinogen, UA: 1 mg/dL (ref 0.0–1.0)

## 2012-01-03 LAB — POCT PREGNANCY, URINE: Preg Test, Ur: NEGATIVE

## 2012-01-03 NOTE — Progress Notes (Signed)
Pt presents for colpo secondary to ASCUS pap with + HR HPV. 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 Pt also with new dx of HIV being followed by ID.Patient given informed consent, signed copy in the chart, time out was performed.  Placed in lithotomy position. Cervix viewed with speculum and colposcope after application of acetic acid.   Colposcopy adequate?  Yes Acetowhite lesions?Yes Punctation?No Mosaicism?  No Abnormal vasculature?  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  She will return in 2 weeks for results.

## 2012-01-03 NOTE — Patient Instructions (Signed)
Colposcopy Care After Colposcopy is a procedure in which a special tool is used to magnify the surface of the cervix. A tissue sample (biopsy) may also be taken. This sample will be looked at for cervical cancer or other problems. After the test:  You may have some cramping.   Lie down for a few minutes if you feel lightheaded.    You may have some bleeding which should stop in a few days.  HOME CARE  Do not have sex or use tampons for 2 to 3 days or as told.   Only take medicine as told by your doctor.   Continue to take your birth control pills as usual.  Finding out the results of your test Ask when your test results will be ready. Make sure you get your test results. GET HELP RIGHT AWAY IF:  You are bleeding a lot or are passing blood clots.   You develop a fever of 102 F (38.9 C) or higher.   You have abnormal vaginal discharge.   You have cramps that do not go away with medicine.   You feel lightheaded, dizzy, or pass out (faint).  MAKE SURE YOU:   Understand these instructions.   Will watch your condition.   Will get help right away if you are not doing well or get worse.  Document Released: 03/05/2008 Document Revised: 09/06/2011 Document Reviewed: 03/05/2008 Exit[employer] Patient Information 2012 [employer]. 

## 2012-01-08 ENCOUNTER — Telehealth: Payer: Self-pay | Admitting: *Deleted

## 2012-01-08 ENCOUNTER — Other Ambulatory Visit: Payer: Self-pay | Admitting: Internal Medicine

## 2012-01-08 DIAGNOSIS — B20 Human immunodeficiency virus [HIV] disease: Secondary | ICD-10-CM

## 2012-01-08 NOTE — Telephone Encounter (Signed)
Called pt and informed her of Pap results and recommended plan of care per [facility] CNM.  Pt voiced understanding that no further testing or treatment is needed @ this time and she will need annual Gyn appt w/Pap smear in 1 yr.  She will call for appt.

## 2012-01-08 NOTE — Telephone Encounter (Signed)
Message copied by Jill Side on Tue Jan 08, 2012  1:31 PM ------      Message from: August Luz      Created: Tue Jan 08, 2012  8:59 AM       Pt needs co-testing in 12 months, please call pt with result and plan for FU. Can cancel 2 week FU appt

## 2012-01-24 ENCOUNTER — Ambulatory Visit: Payer: Self-pay | Admitting: Advanced Practice Midwife

## 2012-02-14 ENCOUNTER — Other Ambulatory Visit (INDEPENDENT_AMBULATORY_CARE_PROVIDER_SITE_OTHER): Payer: Self-pay

## 2012-02-14 DIAGNOSIS — B20 Human immunodeficiency virus [HIV] disease: Secondary | ICD-10-CM

## 2012-02-15 LAB — T-HELPER CELL (CD4) - (RCID CLINIC ONLY): CD4 % Helper T Cell: 12 % — ABNORMAL LOW (ref 33–55)

## 2012-02-26 ENCOUNTER — Ambulatory Visit (INDEPENDENT_AMBULATORY_CARE_PROVIDER_SITE_OTHER): Payer: Self-pay | Admitting: Internal Medicine

## 2012-02-26 ENCOUNTER — Encounter: Payer: Self-pay | Admitting: Internal Medicine

## 2012-02-26 VITALS — BP 134/94 | HR 77 | Temp 98.6°F | Wt 149.0 lb

## 2012-02-26 DIAGNOSIS — B20 Human immunodeficiency virus [HIV] disease: Secondary | ICD-10-CM

## 2012-02-26 NOTE — Progress Notes (Signed)
HIV CLINIC VISIT  RFV = routine follow up Subjective:    Patient ID: Tiffany Morrison, female    DOB: 05/19/1986, 26 y.o.   MRN: [id number]  HPI Tiffany Morrison is a 26yo female with HIV, CD 4 count of 190(12%)/VL 45 on complera, bactrim, azithromycin, valtrex for hsv suppression. She is doing well. Denies missing a dose. She is currently taking only courses of unc-g. No fever/chills/nightsweats. She is noticing having some nausea thought to be related to having empty stomach. She has lost 4 lb since her last visit. She states she is not eating.    Review of Systems  Constitutional: Negative for fever, chills, diaphoresis, activity change, appetite change, fatigue but positive unexpected weight change. Down 4 lbs HENT: Negative for congestion, sore throat, rhinorrhea, sneezing, trouble swallowing and sinus pressure.  Eyes: Negative for photophobia and visual disturbance.  Respiratory: Negative for cough, chest tightness, shortness of breath, wheezing and stridor.  Cardiovascular: Negative for chest pain, palpitations and leg swelling.  Gastrointestinal: positivefor nausea, but negative vomiting, abdominal pain, diarrhea, constipation, blood in stool, abdominal distention and anal bleeding.  Genitourinary: Negative for dysuria, hematuria, flank pain and difficulty urinating.  Musculoskeletal: Negative for myalgias, back pain, joint swelling, arthralgias and gait problem.  Skin: Negative for color change, pallor, rash and wound.  Neurological: Negative for dizziness, tremors, weakness and light-headedness.  Hematological: Negative for adenopathy. Does not bruise/bleed easily.  Psychiatric/Behavioral: Negative for behavioral problems, confusion, sleep disturbance, dysphoric mood, decreased concentration and agitation.       Objective:   Physical Exam BP 134/94  Pulse 77  Temp(Src) 98.6 F (37 C) (Oral)  Wt 149 lb (67.586 kg)  General Appearance:    Alert, cooperative, no distress, appears  stated age  Head:    Normocephalic, without obvious abnormality, atraumatic  Eyes:    PERRL, conjunctiva/corneas clear, EOM's intact,   Ears:    Normal TM's and external ear canals, both ears  Nose:   Nares normal, septum midline, mucosa normal, no drainage    or sinus tenderness  Throat:   Smaller wart lesions to right corner of mouth, and tongue normal; teeth and gums normal  Neck:   Supple, symmetrical, trachea midline, no adenopathy;      Back:     Symmetric, no curvature, ROM normal, no CVA tenderness  Lungs:     Clear to auscultation bilaterally, respirations unlabored      Heart:    Regular rate and rhythm, S1 and S2 normal, no murmur, rub   or gallop              Extremities:   Extremities normal, atraumatic, no cyanosis or edema  Pulses:   2+ and symmetric all extremities  Skin:   Skin color, texture, turgor normal, no rashes or lesions  Lymph nodes:   Cervical, supraclavicular, and axillary nodes normal            Assessment & Plan:  hiv = continue with complera daily, will check CD 4 count and HIV VL in 3 months (2 wk prior to visit)  Viral warts= on lip, improved, getting liquid nitrogen by dermatology  Abn pap = at her latest gyn appt, appears improved. Her next pap is in April 2014.  oi proph = continue with bactrim DS daily. azithro 1200mg  Qwk. Will stop azithro at next visit.  Nausea= will call if it persists to need medication  Weight loss = will watch to see where she is at next visit  rtc in  3 months

## 2012-03-27 ENCOUNTER — Encounter: Payer: Self-pay | Admitting: *Deleted

## 2012-03-31 ENCOUNTER — Telehealth: Payer: Self-pay | Admitting: *Deleted

## 2012-03-31 NOTE — Telephone Encounter (Signed)
Called patient to advise her that the letter she requested is ready to be picked up. Placed the letter in the box at the front.

## 2012-04-28 ENCOUNTER — Other Ambulatory Visit: Payer: Self-pay | Admitting: Licensed Clinical Social Worker

## 2012-04-28 DIAGNOSIS — B20 Human immunodeficiency virus [HIV] disease: Secondary | ICD-10-CM

## 2012-04-28 MED ORDER — EMTRICITAB-RILPIVIR-TENOFOV DF 200-25-300 MG PO TABS: 1.0000 | ORAL_TABLET | Freq: Every day | ORAL | Status: DC

## 2012-05-14 ENCOUNTER — Other Ambulatory Visit: Payer: Self-pay

## 2012-05-14 ENCOUNTER — Other Ambulatory Visit: Payer: Self-pay | Admitting: Internal Medicine

## 2012-05-14 DIAGNOSIS — B20 Human immunodeficiency virus [HIV] disease: Secondary | ICD-10-CM

## 2012-05-14 LAB — CBC WITH DIFFERENTIAL/PLATELET
Basophils Relative: 1 % (ref 0–1)
Eosinophils Absolute: 0.6 10*3/uL (ref 0.0–0.7)
Eosinophils Relative: 16 % — ABNORMAL HIGH (ref 0–5)
Hemoglobin: 12.4 g/dL (ref 12.0–15.0)
Lymphs Abs: 1.7 10*3/uL (ref 0.7–4.0)
MCH: 32.6 pg (ref 26.0–34.0)
MCHC: 35.2 g/dL (ref 30.0–36.0)
MCV: 92.6 fL (ref 78.0–100.0)
Monocytes Relative: 7 % (ref 3–12)
Platelets: 260 10*3/uL (ref 150–400)
RBC: 3.8 MIL/uL — ABNORMAL LOW (ref 3.87–5.11)

## 2012-05-14 LAB — COMPREHENSIVE METABOLIC PANEL
Alkaline Phosphatase: 71 U/L (ref 39–117)
CO2: 27 mEq/L (ref 19–32)
Creat: 0.89 mg/dL (ref 0.50–1.10)
Glucose, Bld: 81 mg/dL (ref 70–99)
Total Bilirubin: 0.3 mg/dL (ref 0.3–1.2)

## 2012-05-15 LAB — HIV-1 RNA QUANT-NO REFLEX-BLD
HIV 1 RNA Quant: 30 copies/mL — ABNORMAL HIGH (ref <20)
HIV-1 RNA Quant, Log: 1.48 {Log} — ABNORMAL HIGH (ref <1.30)

## 2012-05-15 LAB — T-HELPER CELL (CD4) - (RCID CLINIC ONLY)
CD4 % Helper T Cell: 15 % — ABNORMAL LOW (ref 33–55)
CD4 T Cell Abs: 240 uL — ABNORMAL LOW (ref 400–2700)

## 2012-05-28 ENCOUNTER — Ambulatory Visit: Payer: Self-pay | Admitting: Internal Medicine

## 2012-06-03 ENCOUNTER — Ambulatory Visit (INDEPENDENT_AMBULATORY_CARE_PROVIDER_SITE_OTHER): Payer: Self-pay | Admitting: Internal Medicine

## 2012-06-03 ENCOUNTER — Encounter: Payer: Self-pay | Admitting: Internal Medicine

## 2012-06-03 VITALS — BP 161/97 | HR 74 | Temp 98.3°F | Wt 149.0 lb

## 2012-06-03 DIAGNOSIS — B20 Human immunodeficiency virus [HIV] disease: Secondary | ICD-10-CM

## 2012-06-03 NOTE — Progress Notes (Signed)
HIV CLINIC NOTE  RFV: routine visit Subjective:    Patient ID: Tiffany Morrison, female    DOB: 07/28/1986, 28 y.o.   MRN: [id number]  HPI HIV/AIDS, CD 4 count 240/ VL 30 on complera now for roughly 6 months. Doing well. Not missing a dose. She is still getting some liquid nitrogen therapy to her lip for warts. Otherwise, she is keeping busy with school. She is noticing that she is frequently hungry and wonders if this is a side effect of medication. Current Outpatient Prescriptions on File Prior to Visit  Medication Sig Dispense Refill  . ACYCLOVIR PO Take 1 capsule by mouth 3 (three) times daily. Dose unknown & [facility] pharmacy is closed. RX capture did not have any medications listed.       Marland Kitchen azithromycin (ZITHROMAX) 600 MG tablet Take 1,200 mg by mouth every 7 (seven) days.      . Emtricitab-Rilpivir-Tenofovir (COMPLERA) 200-25-300 MG TABS Take 1 tablet by mouth daily.  30 tablet  5  . sulfamethoxazole-trimethoprim (BACTRIM DS) 800-160 MG per tablet Take 1 tablet by mouth daily.       Active Ambulatory Problems    Diagnosis Date Noted  . Genital herpes 09/14/2011  . Abnormal Pap smear of cervix 10/09/2011  . HIV infection 10/09/2011  . Viral warts 12/25/2011   Resolved Ambulatory Problems    Diagnosis Date Noted  . No Resolved Ambulatory Problems   Past Medical History  Diagnosis Date  . Recurrent boils   . Abnormal Pap smear and cervical HPV (human papillomavirus) 2011  . HIV disease      Review of Systems  Constitutional: Negative for fever, chills, diaphoresis, activity change, appetite change, fatigue and unexpected weight change.  HENT: Negative for congestion, sore throat, rhinorrhea, sneezing, trouble swallowing and sinus pressure.  Eyes: Negative for photophobia and visual disturbance.  Respiratory: Negative for cough, chest tightness, shortness of breath, wheezing and stridor.  Cardiovascular: Negative for chest pain, palpitations and leg swelling.    Gastrointestinal: Negative for nausea, vomiting, abdominal pain, diarrhea, constipation, blood in stool, abdominal distention and anal bleeding.  Genitourinary: Negative for dysuria, hematuria, flank pain and difficulty urinating.  Musculoskeletal: Negative for myalgias, back pain, joint swelling, arthralgias and gait problem.  Skin: Negative for color change, pallor, rash and wound.  Neurological: Negative for dizziness, tremors, weakness and light-headedness.  Hematological: Negative for adenopathy. Does not bruise/bleed easily.  Psychiatric/Behavioral: Negative for behavioral problems, confusion, sleep disturbance, dysphoric mood, decreased concentration and agitation.       Objective:   Physical Exam  BP 161/97  Pulse 74  Temp 98.3 F (36.8 C) (Oral)  Wt 149 lb (67.586 kg)   General Appearance:    Alert, cooperative, no distress, appears stated age  Head:    Normocephalic, without obvious abnormality, atraumatic  Eyes:    PERRL, conjunctiva/corneas clear, EOM's intact, fundi    benign, both eyes  Ears:    Normal TM's and external ear canals, both ears  Nose:   Nares normal, septum midline, mucosa normal, no drainage    or sinus tenderness  Throat:   Lips, mucosa, and tongue normal; teeth and gums normal  Neck:   Supple, symmetrical, trachea midline, no adenopathy;    thyroid:  no enlargement/tenderness/nodules; no carotid   bruit or JVD  Back:     Symmetric, no curvature, ROM normal, no CVA tenderness  Lungs:     Clear to auscultation bilaterally, respirations unlabored  Chest Wall:    No tenderness or deformity  Heart:    Regular rate and rhythm, S1 and S2 normal, no murmur, rub   or gallop     Abdomen:     Soft, non-tender, bowel sounds active all four quadrants,    no masses, no organomegaly        Extremities:   Extremities normal, atraumatic, no cyanosis or edema  Pulses:   2+ and symmetric all extremities  Skin:   Skin color, texture, turgor normal, no rashes  or lesions  Lymph nodes:   Cervical, supraclavicular, and axillary nodes normal  Neurologic:   CNII-XII intact, normal strength, sensation and reflexes    throughout        Assessment & Plan:  HIV = continue with complera with meals  PCP proph = continue with bactrim until next appt. Will discontinue at next appt.  Birth control = not using anything current. Currently having menses. Anticipated to do depot again  MAC proph = can stop azithromycin now.  Frequently hungry = feels alittle worse.mentioned to do a trial of frequent snacks  Insurance = is running out at the end of the month. Will need to coordinate with Britta Mccreedy for ADAP.  Warts = followed by derm to do liquid nitrogen at right corner mouth  Health maintenance = will call her when flu shot is available.

## 2012-06-10 ENCOUNTER — Ambulatory Visit: Payer: Self-pay

## 2012-06-13 ENCOUNTER — Ambulatory Visit: Payer: Self-pay

## 2012-08-19 ENCOUNTER — Other Ambulatory Visit (INDEPENDENT_AMBULATORY_CARE_PROVIDER_SITE_OTHER): Payer: Self-pay

## 2012-08-19 ENCOUNTER — Ambulatory Visit (INDEPENDENT_AMBULATORY_CARE_PROVIDER_SITE_OTHER): Payer: Self-pay

## 2012-08-19 DIAGNOSIS — B20 Human immunodeficiency virus [HIV] disease: Secondary | ICD-10-CM

## 2012-08-19 DIAGNOSIS — Z23 Encounter for immunization: Secondary | ICD-10-CM

## 2012-08-19 DIAGNOSIS — Z21 Asymptomatic human immunodeficiency virus [HIV] infection status: Secondary | ICD-10-CM

## 2012-08-20 LAB — COMPREHENSIVE METABOLIC PANEL
ALT: 13 U/L (ref 0–35)
AST: 21 U/L (ref 0–37)
Calcium: 9.3 mg/dL (ref 8.4–10.5)
Chloride: 103 mEq/L (ref 96–112)
Creat: 0.86 mg/dL (ref 0.50–1.10)
Sodium: 137 mEq/L (ref 135–145)
Total Protein: 7.6 g/dL (ref 6.0–8.3)

## 2012-08-20 LAB — T-HELPER CELL (CD4) - (RCID CLINIC ONLY)
CD4 % Helper T Cell: 19 % — ABNORMAL LOW (ref 33–55)
CD4 T Cell Abs: 470 uL (ref 400–2700)

## 2012-08-20 LAB — CBC WITH DIFFERENTIAL/PLATELET
Basophils Absolute: 0 10*3/uL (ref 0.0–0.1)
Eosinophils Relative: 7 % — ABNORMAL HIGH (ref 0–5)
Lymphocytes Relative: 36 % (ref 12–46)
MCV: 94.9 fL (ref 78.0–100.0)
Neutro Abs: 3.1 10*3/uL (ref 1.7–7.7)
Neutrophils Relative %: 49 % (ref 43–77)
Platelets: 243 10*3/uL (ref 150–400)
RDW: 14.3 % (ref 11.5–15.5)
WBC: 6.2 10*3/uL (ref 4.0–10.5)

## 2012-09-02 ENCOUNTER — Encounter: Payer: Self-pay | Admitting: *Deleted

## 2012-09-02 ENCOUNTER — Encounter: Payer: Self-pay | Admitting: Internal Medicine

## 2012-09-02 ENCOUNTER — Ambulatory Visit (INDEPENDENT_AMBULATORY_CARE_PROVIDER_SITE_OTHER): Payer: Self-pay | Admitting: Internal Medicine

## 2012-09-02 VITALS — BP 135/84 | HR 75 | Temp 98.0°F | Ht 69.0 in | Wt 161.2 lb

## 2012-09-02 DIAGNOSIS — B20 Human immunodeficiency virus [HIV] disease: Secondary | ICD-10-CM

## 2012-09-02 DIAGNOSIS — R638 Other symptoms and signs concerning food and fluid intake: Secondary | ICD-10-CM

## 2012-09-02 DIAGNOSIS — R635 Abnormal weight gain: Secondary | ICD-10-CM

## 2012-09-02 NOTE — Progress Notes (Signed)
HIV CLINIC NOTE  RFV: routine visit Subjective:    Patient ID: Tiffany Morrison, female    DOB: 1985/12/13, 26 y.o.   MRN: [id number]  HPI Cd 4 count 470/VL 36. Taking meds regularly, only missed 1 dose in the last 3 months. Doing very well with adherence, however, she is not taking her medications around the same time of the day and switch meals. Feels like she has gained 20 lb since starting her HIV meds. Stressed about eating enough calories for her HIV medications. She has been smoking marijuana to stimulate appetite occasionally. She has not had recent problem with any warts erupting on her lips as they had been over the summer. Taking her finals this week in school.  Current Outpatient Prescriptions on File Prior to Visit  Medication Sig Dispense Refill  . ACYCLOVIR PO Take 1 capsule by mouth 3 (three) times daily. Dose unknown & [facility] pharmacy is closed. RX capture did not have any medications listed.       . Emtricitab-Rilpivir-Tenofovir (COMPLERA) 200-25-300 MG TABS Take 1 tablet by mouth daily.  30 tablet  5  . sulfamethoxazole-trimethoprim (BACTRIM DS) 800-160 MG per tablet Take 1 tablet by mouth daily.          Review of Systems 10 point ROS reviewed,    Objective:   Physical Exam BP 135/84  Pulse 75  Temp 98 F (36.7 C) (Oral)  Ht 5\' 9"  (1.753 m)  Wt 161 lb 4 oz (73.143 kg)  BMI 23.81 kg/m2  LMP 08/26/2012 Physical Exam  Constitutional:oriented to person, place, and time.  appears well-developed and well-nourished. No distress.  HENT:  Mouth/Throat: Oropharynx is clear and moist. No oropharyngeal exudate.  Cardiovascular: Normal rate, regular rhythm and normal heart sounds. Exam reveals no gallop and no friction rub.  No murmur heard.  Pulmonary/Chest: Effort normal and breath sounds normal. No respiratory distress. He has no wheezes.  Lymphadenopathy:  no cervical adenopathy.  Neurological:  alert and oriented to person, place, and time.  Skin: Skin is warm and  dry. No rash noted. No erythema.         Assessment & Plan:  hiv = complera with meals.she has some variability with taking complera, different meals.now will work with always taking at the same time of the ay. Maybe switch to stribild if still having difficulty with eating.  oi prophylaxis = can stop taking bactrim  hsv proph = continue valtrex   Weight gain = she reports her baseline being 145 but is 32ft 9 in. Currently weighing 160. Will continue to monitor at next visit.   rtc 4 months

## 2012-11-03 ENCOUNTER — Other Ambulatory Visit: Payer: Self-pay | Admitting: *Deleted

## 2012-11-03 DIAGNOSIS — B20 Human immunodeficiency virus [HIV] disease: Secondary | ICD-10-CM

## 2012-11-03 MED ORDER — EMTRICITAB-RILPIVIR-TENOFOV DF 200-25-300 MG PO TABS: 1.0000 | ORAL_TABLET | Freq: Every day | ORAL | Status: DC

## 2012-11-03 MED ORDER — VALACYCLOVIR HCL 500 MG PO TABS: 500.0000 mg | ORAL_TABLET | Freq: Two times a day (BID) | ORAL | Status: DC

## 2012-11-05 ENCOUNTER — Encounter: Payer: Self-pay | Admitting: Physician Assistant

## 2012-11-05 NOTE — Progress Notes (Signed)
  Subjective:     Tiffany Morrison is a 27 y.o. G15P0020 female and is here for a comprehensive physical exam. The patient reports having a painful sores on her vulva, wants evaluation. No prior episodes.  History   Social History  . Marital Status: Single    Spouse Name: N/A    Number of Children: N/A  . Years of Education: N/A   Occupational History  . Not on file.   Social History Main Topics  . Smoking status: Current Every Day Smoker -- 0.2 packs/day    Types: Cigarettes  . Smokeless tobacco: Never Used  . Alcohol Use: 0.6 oz/week    1 Glasses of wine per week  . Drug Use: No  . Sexually Active: Yes    Birth Control/ Protection: Abstinence     Comment: given condoms   Other Topics Concern  . Not on file   Social History Narrative  . No narrative on file   Health Maintenance  Topic Date Due  . Tetanus/tdap  03/11/2003  . Influenza Vaccine  06/01/2013  . Pap Smear  01/03/2015    The following portions of the patient's history were reviewed and updated as appropriate: allergies, current medications, past family history, past medical history, past social history, past surgical history and problem list.  Review of Systems Pertinent items are noted in HPI.   Objective:   BP 137/84  Pulse 71  Temp 97.2 F (36.2 C) (Oral)  Ht 5\' 7"  (1.702 m)  Wt 149 lb 12.8 oz (67.949 kg)  BMI 23.46 kg/m2 GENERAL: Well-developed, well-nourished female in no acute distress.  HEENT: Normocephalic, atraumatic. Sclerae anicteric.  NECK: Supple. Normal thyroid.  LUNGS: Clear to auscultation bilaterally.  HEART: Regular rate and rhythm. BREASTS: Symmetric in size. No masses, skin changes, nipple drainage, or lymphadenopathy. ABDOMEN: Soft, nontender, nondistended. No organomegaly. PELVIC: Normal external female genitalia; small open ulcers visualized near posterior fourchette. HSV culture obtained. Vagina is pink and rugated.  Normal discharge. Normal cervix contour. Pap smear obtained.  Uterus is normal in size. No adnexal mass or tenderness.  EXTREMITIES: No cyanosis, clubbing, or edema, 2+ distal pulses.   Assessment:    Healthy female exam.  Vulvar lesions concerning for HSV     Plan:   Will follow up pap smear and HSV cultures and manage accordingly. Patient advised to get comprehensive STI testing

## 2012-12-22 ENCOUNTER — Ambulatory Visit (INDEPENDENT_AMBULATORY_CARE_PROVIDER_SITE_OTHER): Payer: Self-pay | Admitting: *Deleted

## 2012-12-22 DIAGNOSIS — IMO0002 Reserved for concepts with insufficient information to code with codable children: Secondary | ICD-10-CM

## 2012-12-22 DIAGNOSIS — R6889 Other general symptoms and signs: Secondary | ICD-10-CM

## 2012-12-22 DIAGNOSIS — Z124 Encounter for screening for malignant neoplasm of cervix: Secondary | ICD-10-CM

## 2012-12-22 NOTE — Patient Instructions (Addendum)
Your results will be ready in about a week.  You can look them up on MyChart.  Thank you for coming to the Center for your care.  Angelique Blonder, Charity fundraiser. Pt informed that PAP smear was abnormal and that a referral has been made to [facility] for follow-up of abnormal PAP.  Pt verbalized understanding.  [facility] will contact pt with appt information.

## 2012-12-22 NOTE — Progress Notes (Signed)
  Subjective:     Tiffany Morrison is a 27 y.o. woman who comes in today for a  pap smear only.  Previous abnormal Pap smears: yes. Contraception:  Condoms.  Pelvic Exam:  Pap smear obtained.   Assessment:    Screening pap smear.   Plan:    Follow up in one year, or as indicated by Pap results.  Pt given condoms. Pt given educational materials re: HIV, heart disease, nutrition, diet, Affordable Care Act and PAP smears.

## 2012-12-24 NOTE — Addendum Note (Signed)
Addended by: Jennet Maduro D on: 12/24/2012 01:56 PM   Modules accepted: Level of Service

## 2012-12-26 NOTE — Addendum Note (Signed)
Addended by: Jennet Maduro D on: 12/26/2012 02:32 PM   Modules accepted: Orders

## 2013-01-01 ENCOUNTER — Encounter: Payer: Self-pay | Admitting: *Deleted

## 2013-01-01 ENCOUNTER — Other Ambulatory Visit: Payer: Self-pay | Admitting: Internal Medicine

## 2013-01-01 ENCOUNTER — Other Ambulatory Visit: Payer: Self-pay

## 2013-01-01 DIAGNOSIS — Z79899 Other long term (current) drug therapy: Secondary | ICD-10-CM

## 2013-01-01 DIAGNOSIS — Z113 Encounter for screening for infections with a predominantly sexual mode of transmission: Secondary | ICD-10-CM

## 2013-01-01 DIAGNOSIS — B20 Human immunodeficiency virus [HIV] disease: Secondary | ICD-10-CM

## 2013-01-01 LAB — COMPREHENSIVE METABOLIC PANEL
BUN: 8 mg/dL (ref 6–23)
CO2: 28 mEq/L (ref 19–32)
Creat: 0.9 mg/dL (ref 0.50–1.10)
Glucose, Bld: 81 mg/dL (ref 70–99)
Total Bilirubin: 0.5 mg/dL (ref 0.3–1.2)
Total Protein: 7.4 g/dL (ref 6.0–8.3)

## 2013-01-01 LAB — CBC WITH DIFFERENTIAL/PLATELET
Eosinophils Absolute: 0.3 10*3/uL (ref 0.0–0.7)
Eosinophils Relative: 6 % — ABNORMAL HIGH (ref 0–5)
Hemoglobin: 13.2 g/dL (ref 12.0–15.0)
Lymphs Abs: 1.9 10*3/uL (ref 0.7–4.0)
MCH: 32 pg (ref 26.0–34.0)
MCHC: 34.7 g/dL (ref 30.0–36.0)
MCV: 92.2 fL (ref 78.0–100.0)
Monocytes Relative: 7 % (ref 3–12)
RBC: 4.12 MIL/uL (ref 3.87–5.11)

## 2013-01-01 LAB — LIPID PANEL
Cholesterol: 198 mg/dL (ref 0–200)
LDL Cholesterol: 145 mg/dL — ABNORMAL HIGH (ref 0–99)
Triglycerides: 86 mg/dL (ref <150)

## 2013-01-02 LAB — RPR

## 2013-01-02 LAB — HIV-1 RNA QUANT-NO REFLEX-BLD
HIV 1 RNA Quant: 38 copies/mL — ABNORMAL HIGH (ref <20)
HIV-1 RNA Quant, Log: 1.58 {Log} — ABNORMAL HIGH (ref <1.30)

## 2013-01-05 ENCOUNTER — Telehealth: Payer: Self-pay | Admitting: *Deleted

## 2013-01-05 NOTE — Telephone Encounter (Signed)
Called patient to advise her of the +chlamydia test, per Dr Drue Second. The patient was upset and she is coming in tomorrow 01/06/13 to be treated. Also advised her that her partner(s) need to be treated as well to stop the contact and transfer. Also reminded her of the importance of using condoms at all times.

## 2013-01-06 ENCOUNTER — Ambulatory Visit (INDEPENDENT_AMBULATORY_CARE_PROVIDER_SITE_OTHER): Payer: Self-pay | Admitting: Internal Medicine

## 2013-01-06 ENCOUNTER — Ambulatory Visit: Payer: Self-pay

## 2013-01-06 DIAGNOSIS — A749 Chlamydial infection, unspecified: Secondary | ICD-10-CM

## 2013-01-06 MED ORDER — AZITHROMYCIN 250 MG PO TABS
1000.0000 mg | ORAL_TABLET | Freq: Once | ORAL | Status: AC
  Administered 2013-01-06: 1000 mg via ORAL

## 2013-01-15 ENCOUNTER — Encounter: Payer: Self-pay | Admitting: Obstetrics & Gynecology

## 2013-01-15 ENCOUNTER — Ambulatory Visit: Payer: Self-pay | Admitting: Internal Medicine

## 2013-01-22 ENCOUNTER — Ambulatory Visit: Payer: Self-pay | Admitting: Internal Medicine

## 2013-02-04 ENCOUNTER — Ambulatory Visit (INDEPENDENT_AMBULATORY_CARE_PROVIDER_SITE_OTHER): Payer: Self-pay | Admitting: Internal Medicine

## 2013-02-04 VITALS — BP 155/102 | HR 75 | Temp 97.6°F | Wt 161.0 lb

## 2013-02-04 DIAGNOSIS — IMO0002 Reserved for concepts with insufficient information to code with codable children: Secondary | ICD-10-CM

## 2013-02-04 DIAGNOSIS — B20 Human immunodeficiency virus [HIV] disease: Secondary | ICD-10-CM

## 2013-02-04 DIAGNOSIS — R6889 Other general symptoms and signs: Secondary | ICD-10-CM

## 2013-02-04 NOTE — Progress Notes (Signed)
[facility] HIV CLINIC NOTE  RFV: recent screening + chlamydia Subjective:    Patient ID: Tiffany Morrison, female    DOB: 11/18/1985, 27 y.o.   MRN: [id number]  HPI Tahni recently did annual testing for GC/chlamydia and pap smear. 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 Chlamydia + despite her only being with one partner. She found out that her partner likely stepped out on their relationship. She is here to receive therapy   Review of Systems     Objective:   Physical Exam        Assessment & Plan:  Chlamydia = gave 1 gm of azithromycin. Recommended her partner gets treated. Recommend to use condoms until he is treated.GC negative from recent test on 4/3

## 2013-02-04 NOTE — Progress Notes (Signed)
[facility] HIV CLINIC NOTE  RFV: routine Subjective:    Patient ID: Tiffany Morrison, female    DOB: [birth year], 27 y.o.   MRN: [id number]  HPI Cd 4 count 380/VL 38, on complera. Doing very well with adherence.no more nausea. Missed about 6 days of meds since last blood work.  04-Jul-2023 her father passed away. Dropped out of school in Fall 2013 and her financial aid has stopped now. 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  She was seen in early April for being treated for chlamydia acquired from her long term partner. They are no longer together.   She confides in her sisters for support.    Current Outpatient Prescriptions on File Prior to Visit  Medication Sig Dispense Refill  . Emtricitab-Rilpivir-Tenofovir (COMPLERA) 200-25-300 MG TABS Take 1 tablet by mouth daily.  30 tablet  11  . valACYclovir (VALTREX) 500 MG tablet Take 500 mg by mouth 2 (two) times daily.       No current facility-administered medications on file prior to visit.   Active Ambulatory Problems    Diagnosis Date Noted  . Genital herpes 09/14/2011  . Abnormal Pap smear of cervix 10/09/2011  . HIV infection 10/09/2011  . Viral warts 12/25/2011   Resolved Ambulatory Problems    Diagnosis Date Noted  . No Resolved Ambulatory Problems   Past Medical History  Diagnosis Date  . Recurrent boils   . Abnormal Pap smear and cervical HPV (human papillomavirus) 2011  . HIV disease       Review of Systems  Constitutional: Negative for fever, chills, diaphoresis, activity change, appetite change, fatigue and unexpected weight change.  HENT: Negative for congestion, sore throat, rhinorrhea, sneezing, trouble swallowing and sinus pressure.  Eyes: Negative for photophobia and visual disturbance.  Respiratory: Negative for cough, chest tightness, shortness of breath, wheezing and stridor.  Cardiovascular: Negative for chest pain, palpitations and leg swelling.  Gastrointestinal: Negative for nausea,  vomiting, abdominal pain, diarrhea, constipation, blood in stool, abdominal distention and anal bleeding.  Genitourinary: Negative for dysuria, hematuria, flank pain and difficulty urinating.  Musculoskeletal: Negative for myalgias, back pain, joint swelling, arthralgias and gait problem.  Skin: Negative for color change, pallor, rash and wound.  Neurological: Negative for dizziness, tremors, weakness and light-headedness.  Hematological: Negative for adenopathy. Does not bruise/bleed easily.  Psychiatric/Behavioral: in a better mood since breaking up in her relationship      Objective:   Physical Exam BP 155/102  Pulse 75  Temp(Src) 97.6 F (36.4 C) (Oral)  Wt 161 lb (73.029 kg)  BMI 23.76 kg/m2  LMP 01/15/2013 Physical Exam  Constitutional:  oriented to person, place, and time. appears well-developed and well-nourished. No distress.  HENT:  Mouth/Throat: Oropharynx is clear and moist. No oropharyngeal exudate.  Cardiovascular: Normal rate, regular rhythm and normal heart sounds. Exam reveals no gallop and no friction rub.  No murmur heard.  Pulmonary/Chest: Effort normal and breath sounds normal. No respiratory distress. He has no wheezes.  Abdominal: Soft. Bowel sounds are normal. He exhibits no distension. There is no tenderness.  Lymphadenopathy:  no cervical adenopathy.  Skin: Skin is warm and dry. No rash noted. No erythema.  Psychiatric: upbeat      Assessment & Plan:  hiv = continue with complera. had a late restart of adap application so without meds for 1 wk.  Continue with current regimen until next visit. We will see if she achieves viral suppression despite 1 wk lapse of  meds. If not, will consider changing regimen.   Abn. Pap = patient to get colposcopy  rtc in 3 months

## 2013-02-06 ENCOUNTER — Encounter: Payer: Self-pay | Admitting: Obstetrics & Gynecology

## 2013-02-27 ENCOUNTER — Encounter: Payer: Self-pay | Admitting: Obstetrics & Gynecology

## 2013-03-26 ENCOUNTER — Ambulatory Visit (INDEPENDENT_AMBULATORY_CARE_PROVIDER_SITE_OTHER): Payer: Self-pay | Admitting: Obstetrics and Gynecology

## 2013-03-26 ENCOUNTER — Encounter: Payer: Self-pay | Admitting: Obstetrics and Gynecology

## 2013-03-26 ENCOUNTER — Other Ambulatory Visit (HOSPITAL_COMMUNITY)
Admission: RE | Admit: 2013-03-26 | Discharge: 2013-03-26 | Disposition: A | Discharge status: Home / Self Care | Payer: Self-pay | Source: Ambulatory Visit | Attending: Obstetrics and Gynecology | Admitting: Obstetrics and Gynecology

## 2013-03-26 VITALS — BP 153/92 | HR 82 | Temp 99.5°F | Ht 69.0 in | Wt 160.0 lb

## 2013-03-26 DIAGNOSIS — IMO0002 Reserved for concepts with insufficient information to code with codable children: Secondary | ICD-10-CM

## 2013-03-26 DIAGNOSIS — R6889 Other general symptoms and signs: Secondary | ICD-10-CM

## 2013-03-26 DIAGNOSIS — N870 Mild cervical dysplasia: Secondary | ICD-10-CM | POA: Insufficient documentation

## 2013-03-26 NOTE — Progress Notes (Signed)
Patient ID: Tiffany Morrison, female   DOB: Aug 12, 1986, 27 y.o.   MRN: [id number] 27 yo with HIV and LGSIL on 11/2012 pap smear here for colposcopy  Patient given informed consent, signed copy in the chart, time out was performed.  Placed in lithotomy position. Cervix viewed with speculum and colposcope after application of acetic acid.   Colposcopy adequate?  No, TZ not visualized Acetowhite lesions?yes 12 o'clock Punctation?no Mosaicism?  no Abnormal vasculature?  no Biopsies?yes 12 o'clock ECC?yes  COMMENTS: Patient was given post procedure instructions.  She will return in 2 weeks for results.

## 2013-03-31 ENCOUNTER — Encounter: Payer: Self-pay | Admitting: *Deleted

## 2013-03-31 ENCOUNTER — Telehealth: Payer: Self-pay | Admitting: *Deleted

## 2013-03-31 NOTE — Telephone Encounter (Addendum)
Message copied by Jill Side on Tue Mar 31, 2013 12:16 PM ------      Message from: CONSTANT, Gigi Gin      Created: Tue Mar 31, 2013  9:12 AM       Please inform patient of colposcopy result consistent with pap smear results. Needs to repeat pap smear in 6 months. Please stress the importance that patient follow up for her 6 month appointment            Peggy  ------ Called pt and left message that I am calling with test results. Please call back and leave message stating whether or not we may leave detailed information on your voice mail. 7/2  0940 Pt had left message on nurse voice mail that she was returning our call. She did not state if detailed information could be left on her voice mail I called pt again and left message that we need her permission to leave personal medical information on her voice mail. Please call back. 7/2  1106 Pt left message stating that we could leave detailed information on her voice mail. I called her and was able to speak with her directly. I informed her of colposcopy results and the need for repeat Pap in 6 mos. I advised her to call in November to schedule appt in December. Pt voiced understanding.

## 2013-04-30 ENCOUNTER — Other Ambulatory Visit (INDEPENDENT_AMBULATORY_CARE_PROVIDER_SITE_OTHER): Payer: Self-pay

## 2013-04-30 DIAGNOSIS — B20 Human immunodeficiency virus [HIV] disease: Secondary | ICD-10-CM

## 2013-04-30 LAB — CBC WITH DIFFERENTIAL/PLATELET
Eosinophils Absolute: 0.4 10*3/uL (ref 0.0–0.7)
Eosinophils Relative: 6 % — ABNORMAL HIGH (ref 0–5)
Hemoglobin: 13 g/dL (ref 12.0–15.0)
Lymphs Abs: 2.2 10*3/uL (ref 0.7–4.0)
MCH: 32.4 pg (ref 26.0–34.0)
MCV: 92.5 fL (ref 78.0–100.0)
Monocytes Absolute: 0.3 10*3/uL (ref 0.1–1.0)
Monocytes Relative: 6 % (ref 3–12)
RBC: 4.01 MIL/uL (ref 3.87–5.11)

## 2013-04-30 LAB — COMPREHENSIVE METABOLIC PANEL
BUN: 8 mg/dL (ref 6–23)
CO2: 24 mEq/L (ref 19–32)
Creat: 0.95 mg/dL (ref 0.50–1.10)
Glucose, Bld: 88 mg/dL (ref 70–99)
Total Bilirubin: 0.4 mg/dL (ref 0.3–1.2)
Total Protein: 7.5 g/dL (ref 6.0–8.3)

## 2013-05-01 ENCOUNTER — Other Ambulatory Visit: Payer: Self-pay | Admitting: *Deleted

## 2013-05-01 DIAGNOSIS — B029 Zoster without complications: Secondary | ICD-10-CM

## 2013-05-01 LAB — HIV-1 RNA QUANT-NO REFLEX-BLD
HIV 1 RNA Quant: <20 copies/mL (ref <20)
HIV-1 RNA Quant, Log: <1.3 {Log} (ref <1.30)

## 2013-05-01 LAB — T-HELPER CELL (CD4) - (RCID CLINIC ONLY): CD4 % Helper T Cell: 20 % — ABNORMAL LOW (ref 33–55)

## 2013-05-01 MED ORDER — VALACYCLOVIR HCL 500 MG PO TABS: 500.0000 mg | ORAL_TABLET | Freq: Two times a day (BID) | ORAL | Status: DC

## 2013-05-14 ENCOUNTER — Ambulatory Visit: Payer: Self-pay | Admitting: Internal Medicine

## 2013-05-19 ENCOUNTER — Ambulatory Visit: Payer: Self-pay | Admitting: Internal Medicine

## 2013-05-20 ENCOUNTER — Telehealth: Payer: Self-pay | Admitting: *Deleted

## 2013-05-20 NOTE — Telephone Encounter (Signed)
Called patient to try and reschedule her missed appt and she advised she forgot due to school starting and all the drama. She rescheduled to 06/09/13 advised her if something comes up to please call the office and cancel or reschedule.

## 2013-06-02 ENCOUNTER — Emergency Department (INDEPENDENT_AMBULATORY_CARE_PROVIDER_SITE_OTHER)
Admission: EM | Admit: 2013-06-02 | Discharge: 2013-06-02 | Disposition: A | Discharge status: Home / Self Care | Payer: Self-pay | Source: Home / Self Care

## 2013-06-02 ENCOUNTER — Encounter (HOSPITAL_COMMUNITY): Payer: Self-pay | Admitting: Emergency Medicine

## 2013-06-02 DIAGNOSIS — M25511 Pain in right shoulder: Secondary | ICD-10-CM

## 2013-06-02 DIAGNOSIS — M67919 Unspecified disorder of synovium and tendon, unspecified shoulder: Secondary | ICD-10-CM

## 2013-06-02 DIAGNOSIS — M25519 Pain in unspecified shoulder: Secondary | ICD-10-CM

## 2013-06-02 MED ORDER — TRAMADOL HCL 50 MG PO TABS: 50.0000 mg | ORAL_TABLET | Freq: Four times a day (QID) | ORAL | Status: DC | PRN

## 2013-06-02 MED ORDER — MELOXICAM 15 MG PO TABS: 15.0000 mg | ORAL_TABLET | Freq: Every day | ORAL | Status: DC

## 2013-06-02 NOTE — ED Provider Notes (Signed)
Medical screening examination/treatment/procedure(s) were performed by a resident physician or non-physician practitioner and as the supervising physician I was immediately available for consultation/collaboration.  Evan Corey, MD   Evan S Corey, MD 06/02/13 2007 

## 2013-06-02 NOTE — ED Notes (Signed)
C/o pain in right shoulder with limited ROM. Numbness in three fingers off/on. Denies injury. Onset 3 wks ago. Pt hand not tried any otc meds for symptoms.   Pt also request pregnancy testing. Due to irregular cycle.

## 2013-06-02 NOTE — ED Provider Notes (Signed)
CSN: [id number]     Arrival date & time 06/02/13  1153 History   First MD Initiated Contact with Patient 06/02/13 1254     Chief Complaint  Patient presents with  . Shoulder Pain    right shoulder pain denies injury.   (Consider location/radiation/quality/duration/timing/severity/associated sxs/prior Treatment) HPI Comments: 27 year old female presents complaining of  right shoulder pain and decreased range of motion. For the past 3 weeks, she has had increasing pain in the anterior shoulder and some numbness in her third, fourth, fifth digits. She feels like this began after she had to carry a heavy suitcase 3 weeks ago. The pain has become increasingly more severe and is exacerbated by any movements. She denies any other specific injuries. She has tried taking some Tylenol for this which has not been helpful. No other injury or other systemic symptoms  Patient is a 27 y.o. female presenting with shoulder pain.  Shoulder Pain Pertinent negatives include no chest pain, no abdominal pain and no shortness of breath.    Past Medical History  Diagnosis Date  . Recurrent boils   . Abnormal Pap smear and cervical HPV (human papillomavirus) 2011    colposcopy   . Genital herpes 09/14/2011  . HIV disease    Past Surgical History  Procedure Laterality Date  . Colposcopy  2011  . Ectopic pregnancy surgery  2010, 2007   Family History  Problem Relation Age of Onset  . Cancer Father     liver   History  Substance Use Topics  . Smoking status: Current Every Day Smoker -- 0.25 packs/day    Types: Cigarettes  . Smokeless tobacco: Never Used  . Alcohol Use: 0.6 oz/week    1 Glasses of wine per week   OB History   Grav Para Term Preterm Abortions TAB SAB Ect Mult Living   2 0 0 0 2 0 0 2 0 0      Review of Systems  Constitutional: Negative for fever and chills.  Eyes: Negative for visual disturbance.  Respiratory: Negative for cough and shortness of breath.   Cardiovascular: Negative  for chest pain, palpitations and leg swelling.  Gastrointestinal: Negative for nausea, vomiting and abdominal pain.  Endocrine: Negative for polydipsia and polyuria.  Genitourinary: Negative for dysuria, urgency and frequency.  Musculoskeletal: Positive for arthralgias. Negative for myalgias.  Skin: Negative for rash.  Neurological: Negative for dizziness, weakness and light-headedness.    Allergies  Tomato  Home Medications   Current Outpatient Rx  Name  Route  Sig  Dispense  Refill  . Emtricitab-Rilpivir-Tenofovir (COMPLERA) 200-25-300 MG TABS   Oral   Take 1 tablet by mouth daily.   30 tablet   11   . meloxicam (MOBIC) 15 MG tablet   Oral   Take 1 tablet (15 mg total) by mouth daily.   30 tablet   1   . traMADol (ULTRAM) 50 MG tablet   Oral   Take 1 tablet (50 mg total) by mouth every 6 (six) hours as needed for pain.   20 tablet   0   . valACYclovir (VALTREX) 500 MG tablet   Oral   Take 1 tablet (500 mg total) by mouth 2 (two) times daily.   60 tablet   2    BP 128/85  Pulse 68  Temp(Src) 99.1 F (37.3 C) (Oral)  Resp 12  SpO2 100%  LMP 05/02/2013 Physical Exam  Nursing note and vitals reviewed. Constitutional: She is oriented to person, place, and time.  She appears well-developed and well-nourished. No distress.  HENT:  Head: Atraumatic.  Pulmonary/Chest: Effort normal. No respiratory distress.  Musculoskeletal:       Right shoulder: She exhibits decreased range of motion, tenderness (tenderness at the proximal anterior deltoid with abduction), pain and decreased strength. She exhibits no bony tenderness, no deformity and no spasm.  Neurological: She is alert and oriented to person, place, and time. No cranial nerve deficit or sensory deficit. She exhibits normal muscle tone. Coordination normal.  Skin: Skin is warm and dry. No rash noted. She is not diaphoretic.  Psychiatric: She has a normal mood and affect. Judgment normal.    ED Course   Procedures (including critical care time) Labs Review Labs Reviewed  POCT PREGNANCY, URINE   Imaging Review No results found.  MDM   1. Shoulder pain, right   2. Deltoid tendinitis of right shoulder    Treat symptomatically, she'll followup with orthopedics if this does not improve  Meds ordered this encounter  Medications  . meloxicam (MOBIC) 15 MG tablet    Sig: Take 1 tablet (15 mg total) by mouth daily.    Dispense:  30 tablet    Refill:  1  . traMADol (ULTRAM) 50 MG tablet    Sig: Take 1 tablet (50 mg total) by mouth every 6 (six) hours as needed for pain.    Dispense:  20 tablet    Refill:  0     Graylon Good, PA-C 06/02/13 1759

## 2013-06-09 ENCOUNTER — Encounter: Payer: Self-pay | Admitting: Internal Medicine

## 2013-06-09 ENCOUNTER — Ambulatory Visit (INDEPENDENT_AMBULATORY_CARE_PROVIDER_SITE_OTHER): Payer: Self-pay | Admitting: Internal Medicine

## 2013-06-09 VITALS — BP 153/105 | HR 65 | Temp 98.8°F | Ht 69.0 in | Wt 161.8 lb

## 2013-06-09 DIAGNOSIS — M25511 Pain in right shoulder: Secondary | ICD-10-CM

## 2013-06-09 DIAGNOSIS — B20 Human immunodeficiency virus [HIV] disease: Secondary | ICD-10-CM

## 2013-06-09 DIAGNOSIS — M25519 Pain in unspecified shoulder: Secondary | ICD-10-CM

## 2013-06-09 DIAGNOSIS — Z23 Encounter for immunization: Secondary | ICD-10-CM

## 2013-06-09 MED ORDER — METHOCARBAMOL 500 MG PO TABS: 500.0000 mg | ORAL_TABLET | Freq: Three times a day (TID) | ORAL | Status: DC

## 2013-06-09 MED ORDER — VALACYCLOVIR HCL 1 G PO TABS: 1000.0000 mg | ORAL_TABLET | Freq: Every day | ORAL | Status: DC

## 2013-06-09 MED ORDER — MELOXICAM 15 MG PO TABS: 15.0000 mg | ORAL_TABLET | Freq: Every day | ORAL | Status: DC

## 2013-06-09 NOTE — Progress Notes (Signed)
[facility] CLINIC NOTE  RFV: routine Subjective:    Patient ID: Tiffany Morrison, female    DOB: May 08, 1986, 27 y.o.   MRN: [id number]  HPI 27yo F, with HIV, CD 4 count 420/VL <20, on complera. Right shoulder pain thought radiating up to neck, thought to be due to bursitis, pain for the last 4th week. No fever ,chills, no recent trauma.  Current Outpatient Prescriptions on File Prior to Visit  Medication Sig Dispense Refill  . Emtricitab-Rilpivir-Tenofovir (COMPLERA) 200-25-300 MG TABS Take 1 tablet by mouth daily.  30 tablet  11  . valACYclovir (VALTREX) 500 MG tablet Take 1 tablet (500 mg total) by mouth 2 (two) times daily.  60 tablet  2  . meloxicam (MOBIC) 15 MG tablet Take 1 tablet (15 mg total) by mouth daily.  30 tablet  1  . traMADol (ULTRAM) 50 MG tablet Take 1 tablet (50 mg total) by mouth every 6 (six) hours as needed for pain.  20 tablet  0   No current facility-administered medications on file prior to visit.   Active Ambulatory Problems    Diagnosis Date Noted  . Genital herpes 09/14/2011  . Abnormal Pap smear of cervix 10/09/2011  . HIV infection 10/09/2011  . Viral warts 12/25/2011  . 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 No Resolved Ambulatory Problems   Past Medical History  Diagnosis Date  . Recurrent boils   . Abnormal Pap smear and cervical HPV (human papillomavirus) 2011  . HIV disease     Review of Systems Right shoulder pain; 12 point ROS is negative    Objective:   Physical Exam  BP 153/105  Pulse 65  Temp(Src) 98.8 F (37.1 C) (Oral)  Ht 5\' 9"  (1.753 m)  Wt 161 lb 12 oz (73.369 kg)  BMI 23.88 kg/m2  LMP 06/04/2013 Physical Exam  Constitutional: \oriented to person, place, and time. \appears well-developed and well-nourished. No distress.  HENT:  Mouth/Throat: Oropharynx is clear and moist. No oropharyngeal exudate.  Cardiovascular: Normal rate,  regular rhythm and normal heart sounds. Exam reveals no gallop and no friction rub.  No murmur heard.  Pulmonary/Chest: Effort normal and breath sounds normal. No respiratory distress. no wheezes.  Abdominal: Soft. Bowel sounds are normal.  exhibits no distension. There is no tenderness.  Lymphadenopathy: no cervical adenopathy.  Ext: right shoulder decrease ROM extension due to pain. Tenderness to trapezius. Tense+ Neurological:  alert and oriented to person, place, and time.  Skin: Skin is warm and dry. No rash noted. No erythema.  Psychiatric:  a normal mood and affect.  behavior is normal.       Assessment & Plan:  HIV = continue on complera  Shoulder pain = will refill meds since she lost prescription; will give mobic, and robaxin for pain management  HSV proph = change valtrex 1gm daily  Health maintenance = will give flu shot  rtc in 3 months

## 2013-07-03 ENCOUNTER — Telehealth: Payer: Self-pay | Admitting: *Deleted

## 2013-07-03 NOTE — Telephone Encounter (Signed)
Shoulder pain improved for a few days, but is now worse, can't be touched.  Requesting appt.  Scheduled pt for Monday, Oct.6 w/ Dr. Ninetta Lights for evaluation.

## 2013-07-06 ENCOUNTER — Encounter: Payer: Self-pay | Admitting: Infectious Diseases

## 2013-07-06 ENCOUNTER — Ambulatory Visit (INDEPENDENT_AMBULATORY_CARE_PROVIDER_SITE_OTHER): Payer: Self-pay | Admitting: Infectious Diseases

## 2013-07-06 VITALS — BP 139/93 | HR 57 | Temp 98.5°F | Ht 69.0 in | Wt 169.0 lb

## 2013-07-06 DIAGNOSIS — M25519 Pain in unspecified shoulder: Secondary | ICD-10-CM

## 2013-07-06 NOTE — Progress Notes (Signed)
  Subjective:    Patient ID: Tiffany Morrison, female    DOB: 12-05-1985, 27 y.o.   MRN: [id number]  HPI  Pt left ama  Review of Systems     Objective:   Physical Exam No exam, pt left AMA       Assessment & Plan:

## 2013-07-15 ENCOUNTER — Ambulatory Visit (INDEPENDENT_AMBULATORY_CARE_PROVIDER_SITE_OTHER): Payer: Self-pay | Admitting: Internal Medicine

## 2013-07-15 ENCOUNTER — Encounter: Payer: Self-pay | Admitting: Internal Medicine

## 2013-07-15 VITALS — BP 150/83 | HR 67 | Temp 98.3°F | Wt 166.0 lb

## 2013-07-15 DIAGNOSIS — M25519 Pain in unspecified shoulder: Secondary | ICD-10-CM

## 2013-07-15 DIAGNOSIS — M25511 Pain in right shoulder: Secondary | ICD-10-CM

## 2013-07-15 NOTE — Progress Notes (Signed)
Subjective:    Patient ID: Tiffany Morrison, female    DOB: [birth year], 27 y.o.   MRN: [id number]  HPI 27yo F with Cd 4 count 420/VL<20, on complera with right shoulder pain still on going despite supportive care. She had sling briefly, ice adn NSAIDS  Current Outpatient Prescriptions on File Prior to Visit  Medication Sig Dispense Refill  . Emtricitab-Rilpivir-Tenofovir (COMPLERA) 200-25-300 MG TABS Take 1 tablet by mouth daily.  30 tablet  11  . meloxicam (MOBIC) 15 MG tablet Take 1 tablet (15 mg total) by mouth daily.  30 tablet  1  . valACYclovir (VALTREX) 1000 MG tablet Take 1 tablet (1,000 mg total) by mouth daily.  30 tablet  11   No current facility-administered medications on file prior to visit.   Active Ambulatory Problems    Diagnosis Date Noted  . Genital herpes 09/14/2011  . Abnormal Pap smear of cervix 10/09/2011  . HIV infection 10/09/2011  . Viral warts 12/25/2011  . 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 No Resolved Ambulatory Problems   Past Medical History  Diagnosis Date  . Recurrent boils   . Abnormal Pap smear and cervical HPV (human papillomavirus) 2011  . HIV disease    History  Substance Use Topics  . Smoking status: Current Every Day Smoker -- 0.25 packs/day    Types: Cigarettes  . Smokeless tobacco: Never Used  . Alcohol Use: 0.6 oz/week    1 Glasses of wine per week  family history includes Cancer in her father.   Review of Systems  Constitutional: Negative for fever, chills, diaphoresis, activity change, appetite change, fatigue and unexpected weight change.  HENT: Negative for congestion, sore throat, rhinorrhea, sneezing, trouble swallowing and sinus pressure.  Eyes: Negative for photophobia and visual disturbance.  Respiratory: Negative for cough, chest tightness, shortness of breath, wheezing and stridor.  Cardiovascular: Negative for chest  pain, palpitations and leg swelling.  Gastrointestinal: Negative for nausea, vomiting, abdominal pain, diarrhea, constipation, blood in stool, abdominal distention and anal bleeding.  Genitourinary: Negative for dysuria, hematuria, flank pain and difficulty urinating.  Musculoskeletal: Negative for myalgias, back pain, joint swelling, arthralgias and gait problem.  Skin: Negative for color change, pallor, rash and wound.  Neurological: Negative for dizziness, tremors, weakness and light-headedness.  Hematological: Negative for adenopathy. Does not bruise/bleed easily.  Psychiatric/Behavioral: Negative for behavioral problems, confusion, sleep disturbance, dysphoric mood, decreased concentration and agitation.       Objective:   Physical Exam BP 150/83  Pulse 67  Temp(Src) 98.3 F (36.8 C) (Oral)  Wt 166 lb (75.297 kg)  BMI 24.5 kg/m2  LMP 07/04/2013 Physical Exam  Constitutional: He is oriented to person, place, and time. He appears well-developed and well-nourished. No distress.  HENT:  Mouth/Throat: Oropharynx is clear and moist. No oropharyngeal exudate.  Cardiovascular: Normal rate, regular rhythm and normal heart sounds. Exam reveals no gallop and no friction rub.  No murmur heard.  Pulmonary/Chest: Effort normal and breath sounds normal. No respiratory distress. He has no wheezes.  Abdominal: Soft. Bowel sounds are normal. He exhibits no distension. There is no tenderness.  Lymphadenopathy:  He has no cervical adenopathy.  Neurological: He is alert and oriented to person, place, and time.  Skin: Skin is warm and dry. No rash noted. No erythema.  Psychiatric: He has a normal mood and affect. His behavior is normal.  Assessment & Plan:  Right shoulder pain bursitis vs. Possible rotator cuff injury = will get mri of shoulder. Vs refer to ortho for evaluation.  hiv = continue on complera  Health maintenance = already got flu vaccine

## 2013-07-17 ENCOUNTER — Other Ambulatory Visit: Payer: Self-pay | Admitting: Internal Medicine

## 2013-07-17 ENCOUNTER — Ambulatory Visit (HOSPITAL_COMMUNITY)
Admission: RE | Admit: 2013-07-17 | Discharge: 2013-07-17 | Disposition: A | Discharge status: Home / Self Care | Payer: Self-pay | Source: Ambulatory Visit | Attending: Internal Medicine | Admitting: Internal Medicine

## 2013-07-17 DIAGNOSIS — M25511 Pain in right shoulder: Secondary | ICD-10-CM

## 2013-07-17 DIAGNOSIS — M25519 Pain in unspecified shoulder: Secondary | ICD-10-CM | POA: Insufficient documentation

## 2013-08-17 ENCOUNTER — Other Ambulatory Visit (INDEPENDENT_AMBULATORY_CARE_PROVIDER_SITE_OTHER): Payer: Self-pay

## 2013-08-17 DIAGNOSIS — B20 Human immunodeficiency virus [HIV] disease: Secondary | ICD-10-CM

## 2013-08-17 LAB — COMPREHENSIVE METABOLIC PANEL
ALT: 12 U/L (ref 0–35)
Albumin: 4.3 g/dL (ref 3.5–5.2)
CO2: 28 mEq/L (ref 19–32)
Chloride: 103 mEq/L (ref 96–112)
Creat: 0.89 mg/dL (ref 0.50–1.10)
Glucose, Bld: 60 mg/dL — ABNORMAL LOW (ref 70–99)
Potassium: 3.8 mEq/L (ref 3.5–5.3)
Sodium: 138 mEq/L (ref 135–145)
Total Protein: 7.7 g/dL (ref 6.0–8.3)

## 2013-08-17 LAB — CBC WITH DIFFERENTIAL/PLATELET
Hemoglobin: 13 g/dL (ref 12.0–15.0)
Lymphocytes Relative: 37 % (ref 12–46)
Lymphs Abs: 2.2 10*3/uL (ref 0.7–4.0)
MCH: 31.8 pg (ref 26.0–34.0)
Monocytes Absolute: 0.4 10*3/uL (ref 0.1–1.0)
Monocytes Relative: 6 % (ref 3–12)
Neutro Abs: 2.9 10*3/uL (ref 1.7–7.7)
Neutrophils Relative %: 50 % (ref 43–77)
RBC: 4.09 MIL/uL (ref 3.87–5.11)
WBC: 5.8 10*3/uL (ref 4.0–10.5)

## 2013-08-18 LAB — T-HELPER CELL (CD4) - (RCID CLINIC ONLY): CD4 % Helper T Cell: 25 % — ABNORMAL LOW (ref 33–55)

## 2013-08-19 LAB — HIV-1 RNA QUANT-NO REFLEX-BLD
HIV 1 RNA Quant: <20 copies/mL (ref <20)
HIV-1 RNA Quant, Log: <1.3 {Log} (ref <1.30)

## 2013-09-07 ENCOUNTER — Ambulatory Visit: Payer: Self-pay | Admitting: Internal Medicine

## 2013-09-22 ENCOUNTER — Ambulatory Visit (INDEPENDENT_AMBULATORY_CARE_PROVIDER_SITE_OTHER): Payer: Self-pay | Admitting: Internal Medicine

## 2013-09-22 ENCOUNTER — Encounter: Payer: Self-pay | Admitting: Internal Medicine

## 2013-09-22 VITALS — BP 143/87 | HR 62 | Temp 98.7°F | Ht 69.0 in | Wt 157.0 lb

## 2013-09-22 DIAGNOSIS — B20 Human immunodeficiency virus [HIV] disease: Secondary | ICD-10-CM

## 2013-09-22 NOTE — Progress Notes (Signed)
Subjective:    Patient ID: Tiffany Morrison, female    DOB: 1986-05-21, 27 y.o.   MRN: [id number]  HPI 27yo F with HIV ,CD 4 count of 600/VL<20, on complera. Doing well. Still has ongoing right shoulder discomfort especially when laying on it as well as with simple range of motion above head. Previously thought to be bursitis.now discomfort for 5 months.no trauma that she remembers. She takes ibuprofen to treat discomfort but she reports having occ. Difficulty with swallowing large pills.  Current Outpatient Prescriptions on File Prior to Visit  Medication Sig Dispense Refill  . Emtricitab-Rilpivir-Tenofovir (COMPLERA) 200-25-300 MG TABS Take 1 tablet by mouth daily.  30 tablet  11  . meloxicam (MOBIC) 15 MG tablet Take 1 tablet (15 mg total) by mouth daily.  30 tablet  1  . valACYclovir (VALTREX) 1000 MG tablet Take 1 tablet (1,000 mg total) by mouth daily.  30 tablet  11   No current facility-administered medications on file prior to visit.   Active Ambulatory Problems    Diagnosis Date Noted  . Genital herpes 09/14/2011  . Abnormal Pap smear of cervix 10/09/2011  . HIV infection 10/09/2011  . Viral warts 12/25/2011  . 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 No Resolved Ambulatory Problems   Past Medical History  Diagnosis Date  . Recurrent boils   . Abnormal Pap smear and cervical HPV (human papillomavirus) 2011  . HIV disease     History   Social History  . Marital Status: Single    Spouse Name: N/A    Number of Children: N/A  . Years of Education: N/A   Social History Main Topics  . Smoking status: Current Every Day Smoker -- 0.25 packs/day    Types: Cigarettes  . Smokeless tobacco: Never Used  . Alcohol Use: 0.6 oz/week    1 Glasses of wine per week  . Drug Use: Yes    Special: Marijuana  . Sexual Activity: Yes    Birth Control/ Protection: Condom     Comment: given  condoms   Other Topics Concern  . None   Social History Narrative  . None   Soc hx: still has no job. Smoke 3 cig per day. Uses MJ to help her appetite  Review of Systems  Constitutional: Negative for fever, chills, diaphoresis, activity change, appetite change, fatigue and unexpected weight change.  HENT: Negative for congestion, sore throat, rhinorrhea, sneezing, trouble swallowing and sinus pressure.  Eyes: Negative for photophobia and visual disturbance.  Respiratory: Negative for cough, chest tightness, shortness of breath, wheezing and stridor.  Cardiovascular: Negative for chest pain, palpitations and leg swelling.  Gastrointestinal: Negative for nausea, vomiting, abdominal pain, diarrhea, constipation, blood in stool, abdominal distention and anal bleeding.  Genitourinary: Negative for dysuria, hematuria, flank pain and difficulty urinating.  Musculoskeletal: Negative for myalgias, back pain, joint swelling, arthralgias and gait problem.  Skin: Negative for color change, pallor, rash and wound.  Neurological: Negative for dizziness, tremors, weakness and light-headedness.  Hematological: Negative for adenopathy. Does not bruise/bleed easily.  Psychiatric/Behavioral: Negative for behavioral problems, confusion, sleep disturbance, dysphoric mood, decreased concentration and agitation.       Objective:   Physical Exam BP 143/87  Pulse 62  Temp(Src) 98.7 F (37.1 C) (Oral)  Ht 5\' 9"  (1.753 m)  Wt 157 lb (71.215 kg)  BMI 23.17 kg/m2  LMP 08/27/2013 Physical Exam  Constitutional:  oriented to person, place, and time. appears well-developed and well-nourished. No distress.  HENT:  Mouth/Throat: Oropharynx is clear and moist. No oropharyngeal exudate.  Cardiovascular: Normal rate, regular rhythm and normal heart sounds. Exam reveals no gallop and no friction rub.  No murmur heard.  Pulmonary/Chest: Effort normal and breath sounds normal. No respiratory distress. no wheezes.   Abdominal: Soft. Bowel sounds are normal.  exhibits no distension. There is no tenderness.  Lymphadenopathy: no cervical adenopathy.  Msk: point tenderness + Skin: Skin is warm and dry. No rash noted. No erythema.  Psychiatric:  a normal mood and affect. behavior is normal.      Assessment & Plan:  hiv = continue with complera. Well controlled HIV  Shoulder pain/strain =  ? Possible tendon strain. Xray did not show any fracture. Will refer to [facility] for evaluation.   Health maintenance = need to make sure to get pap in march

## 2013-09-28 ENCOUNTER — Telehealth: Payer: Self-pay | Admitting: *Deleted

## 2013-09-28 DIAGNOSIS — M25511 Pain in right shoulder: Secondary | ICD-10-CM

## 2013-09-28 NOTE — Telephone Encounter (Signed)
Faxed records to [facility] office, attn: Aram Beecham.   Left message for pt with appointment information.  If pt is unable to pay the $150.00 next week for the appt she needs to call [phone] and reschedule the appt.  If she needs financial assistance she is to call Shameka at [phone].  Driving directions and a map were prepared and mailed to the pt with the appt and financial assistance information and telephone numbers.  Pt will need to pick up a copy of her shoulder x-ray film from 07/17/13 to take with her to her appt in [place].

## 2013-10-02 ENCOUNTER — Encounter: Payer: Self-pay | Admitting: *Deleted

## 2013-10-02 NOTE — Telephone Encounter (Signed)
Error

## 2013-10-02 NOTE — Telephone Encounter (Signed)
RN will call pt back next week to further explain Financial Assistance Counselor and need to make arrangements prior to the MD visit.

## 2013-10-02 NOTE — Telephone Encounter (Signed)
New appt 10/21/12 @ 2PM w/ Dr. Archer AsaHeggerick to give the pt time to speak with financial assistance counselor.

## 2013-10-08 ENCOUNTER — Ambulatory Visit: Payer: Self-pay

## 2013-10-08 ENCOUNTER — Other Ambulatory Visit: Payer: Self-pay | Admitting: *Deleted

## 2013-10-08 DIAGNOSIS — B20 Human immunodeficiency virus [HIV] disease: Secondary | ICD-10-CM

## 2013-10-08 MED ORDER — EMTRICITAB-RILPIVIR-TENOFOV DF 200-25-300 MG PO TABS: 1.0000 | ORAL_TABLET | Freq: Every day | ORAL | Status: DC

## 2013-10-10 ENCOUNTER — Encounter (HOSPITAL_COMMUNITY): Payer: Self-pay | Admitting: Emergency Medicine

## 2013-10-10 ENCOUNTER — Emergency Department (HOSPITAL_COMMUNITY)
Admission: EM | Admit: 2013-10-10 | Discharge: 2013-10-10 | Disposition: A | Discharge status: Home / Self Care | Payer: Self-pay | Attending: Emergency Medicine | Admitting: Emergency Medicine

## 2013-10-10 DIAGNOSIS — H53149 Visual discomfort, unspecified: Secondary | ICD-10-CM | POA: Insufficient documentation

## 2013-10-10 DIAGNOSIS — Z872 Personal history of diseases of the skin and subcutaneous tissue: Secondary | ICD-10-CM | POA: Insufficient documentation

## 2013-10-10 DIAGNOSIS — R112 Nausea with vomiting, unspecified: Secondary | ICD-10-CM | POA: Insufficient documentation

## 2013-10-10 DIAGNOSIS — R519 Headache, unspecified: Secondary | ICD-10-CM

## 2013-10-10 DIAGNOSIS — Z79899 Other long term (current) drug therapy: Secondary | ICD-10-CM | POA: Insufficient documentation

## 2013-10-10 DIAGNOSIS — F17200 Nicotine dependence, unspecified, uncomplicated: Secondary | ICD-10-CM | POA: Insufficient documentation

## 2013-10-10 DIAGNOSIS — Z3202 Encounter for pregnancy test, result negative: Secondary | ICD-10-CM | POA: Insufficient documentation

## 2013-10-10 DIAGNOSIS — Z21 Asymptomatic human immunodeficiency virus [HIV] infection status: Secondary | ICD-10-CM | POA: Insufficient documentation

## 2013-10-10 DIAGNOSIS — Z8619 Personal history of other infectious and parasitic diseases: Secondary | ICD-10-CM | POA: Insufficient documentation

## 2013-10-10 DIAGNOSIS — R51 Headache: Secondary | ICD-10-CM | POA: Insufficient documentation

## 2013-10-10 LAB — URINALYSIS, ROUTINE W REFLEX MICROSCOPIC
BILIRUBIN URINE: NEGATIVE
GLUCOSE, UA: NEGATIVE mg/dL
HGB URINE DIPSTICK: NEGATIVE
Ketones, ur: NEGATIVE mg/dL
Leukocytes, UA: NEGATIVE
Nitrite: NEGATIVE
Protein, ur: NEGATIVE mg/dL
SPECIFIC GRAVITY, URINE: 1.02 (ref 1.005–1.030)
Urobilinogen, UA: 0.2 mg/dL (ref 0.0–1.0)
pH: 8 (ref 5.0–8.0)

## 2013-10-10 LAB — CBC WITH DIFFERENTIAL/PLATELET
Basophils Absolute: 0 10*3/uL (ref 0.0–0.1)
Basophils Relative: 1 % (ref 0–1)
EOS ABS: 0.1 10*3/uL (ref 0.0–0.7)
Eosinophils Relative: 2 % (ref 0–5)
HCT: 40.1 % (ref 36.0–46.0)
Hemoglobin: 14.5 g/dL (ref 12.0–15.0)
Lymphocytes Relative: 17 % (ref 12–46)
Lymphs Abs: 1.5 10*3/uL (ref 0.7–4.0)
MCH: 33.4 pg (ref 26.0–34.0)
MCHC: 36.2 g/dL — ABNORMAL HIGH (ref 30.0–36.0)
MCV: 92.4 fL (ref 78.0–100.0)
MONOS PCT: 4 % (ref 3–12)
Monocytes Absolute: 0.3 10*3/uL (ref 0.1–1.0)
Neutro Abs: 6.4 10*3/uL (ref 1.7–7.7)
Neutrophils Relative %: 76 % (ref 43–77)
Platelets: 242 10*3/uL (ref 150–400)
RBC: 4.34 MIL/uL (ref 3.87–5.11)
RDW: 11.8 % (ref 11.5–15.5)
WBC: 8.3 10*3/uL (ref 4.0–10.5)

## 2013-10-10 LAB — BASIC METABOLIC PANEL
BUN: 7 mg/dL (ref 6–23)
CO2: 28 meq/L (ref 19–32)
Calcium: 9.3 mg/dL (ref 8.4–10.5)
Chloride: 101 mEq/L (ref 96–112)
Creatinine, Ser: 0.79 mg/dL (ref 0.50–1.10)
GFR calc Af Amer: >90 mL/min (ref >90)
GLUCOSE: 90 mg/dL (ref 70–99)
POTASSIUM: 3.8 meq/L (ref 3.7–5.3)
Sodium: 141 mEq/L (ref 137–147)

## 2013-10-10 LAB — POCT PREGNANCY, URINE: Preg Test, Ur: NEGATIVE

## 2013-10-10 MED ORDER — DIPHENHYDRAMINE HCL 50 MG/ML IJ SOLN
25.0000 mg | Freq: Once | INTRAMUSCULAR | Status: AC
  Administered 2013-10-10: 25 mg via INTRAVENOUS
  Filled 2013-10-10: qty 1

## 2013-10-10 MED ORDER — DEXAMETHASONE SODIUM PHOSPHATE 10 MG/ML IJ SOLN
10.0000 mg | Freq: Once | INTRAMUSCULAR | Status: AC
  Administered 2013-10-10: 10 mg via INTRAVENOUS
  Filled 2013-10-10: qty 1

## 2013-10-10 MED ORDER — SODIUM CHLORIDE 0.9 % IV BOLUS (SEPSIS)
1000.0000 mL | Freq: Once | INTRAVENOUS | Status: AC
  Administered 2013-10-10: 1000 mL via INTRAVENOUS

## 2013-10-10 MED ORDER — ONDANSETRON 4 MG PO TBDP
8.0000 mg | ORAL_TABLET | Freq: Once | ORAL | Status: AC
  Administered 2013-10-10: 8 mg via ORAL
  Filled 2013-10-10: qty 2

## 2013-10-10 MED ORDER — METOCLOPRAMIDE HCL 5 MG/ML IJ SOLN
10.0000 mg | Freq: Once | INTRAMUSCULAR | Status: AC
  Administered 2013-10-10: 10 mg via INTRAVENOUS
  Filled 2013-10-10: qty 2

## 2013-10-10 MED ORDER — KETOROLAC TROMETHAMINE 30 MG/ML IJ SOLN
30.0000 mg | Freq: Once | INTRAMUSCULAR | Status: AC
  Administered 2013-10-10: 30 mg via INTRAVENOUS
  Filled 2013-10-10: qty 1

## 2013-10-10 MED ORDER — MELOXICAM 15 MG PO TABS: 15.0000 mg | ORAL_TABLET | Freq: Every day | ORAL | Status: DC

## 2013-10-10 NOTE — Discharge Instructions (Signed)
You were seen and treated for your headache. Please continue to follow up with your primary care provider for continued evaluation and treatments. Return for any changing or worsening symptoms.    Migraine Headache A migraine headache is very bad, throbbing pain on one or both sides of your head. Talk to your doctor about what things may bring on (trigger) your migraine headaches. HOME CARE  Only take medicines as told by your doctor.  Lie down in a dark, quiet room when you have a migraine.  Keep a journal to find out if certain things bring on migraine headaches. For example, write down:  What you eat and drink.  How much sleep you get.  Any change to your diet or medicines.  Lessen how much alcohol you drink.  Quit smoking if you smoke.  Get enough sleep.  Lessen any stress in your life.  Keep lights dim if bright lights bother you or make your migraines worse. GET HELP RIGHT AWAY IF:   Your migraine becomes really bad.  You have a fever.  You have a stiff neck.  You have trouble seeing.  Your muscles are weak, or you lose muscle control.  You lose your balance or have trouble walking.  You feel like you will pass out (faint), or you pass out.  You have really bad symptoms that are different than your first symptoms. MAKE SURE YOU:   Understand these instructions.  Will watch your condition.  Will get help right away if you are not doing well or get worse. Document Released: 06/26/2008 Document Revised: 12/10/2011 Document Reviewed: 05/25/2013 [facility] Patient Information 2014 [employer], [place].

## 2013-10-10 NOTE — ED Notes (Addendum)
Pt reports headache, n/v since this am. shes had these headaches in the past and normally they normally resolve but this one has persisted all day. She has been able to tolerate oral fluids.

## 2013-10-10 NOTE — ED Provider Notes (Signed)
Medical screening examination/treatment/procedure(s) were performed by non-physician practitioner and as supervising physician I was immediately available for consultation/collaboration.  EKG Interpretation   None         Tiffany B. Bernette MayersSheldon, MD 10/10/13 2359

## 2013-10-10 NOTE — ED Provider Notes (Signed)
CSN: [id number]     Arrival date & time 10/10/13  1643 History   First MD Initiated Contact with Patient 10/10/13 2111     Chief Complaint  Patient presents with  . Headache   HPI  History provided by the patient. Patient is a 28 year old female with history of HIV and headaches who presents with complaints of worsening right-sided headache. Patient states she first began having right-sided headache around 9 AM this morning. It has gradually worsened throughout the day. She reports history of similar right-sided headaches in the past they're usually associated with some blurred vision. She does report short period of similar blurred vision that is resolved. Her symptoms today however have also been associated with episodes of nausea and vomiting. Vomiting makes headache worse. Patient did take 2 Excedrin and one Aleve throughout the day without any improvements. She does report having some mild improvements while waiting in the emergency department but then states pains began to become worse again. She denies any other associated symptoms. No fever, chills or sweats. Denies any weakness or numbness in extremities. No confusion or speech changes. No vision loss. No other aggravating or alleviating factors. No other associated symptoms.  Last CD4 count 600, 2 months ago. Viral load undetectable.   Past Medical History  Diagnosis Date  . Recurrent boils   . Abnormal Pap smear and cervical HPV (human papillomavirus) 2011    colposcopy   . Genital herpes 09/14/2011  . HIV disease    Past Surgical History  Procedure Laterality Date  . Colposcopy  2011  . Ectopic pregnancy surgery  2010, 2007   Family History  Problem Relation Age of Onset  . Cancer Father     liver   History  Substance Use Topics  . Smoking status: Current Every Day Smoker -- 0.25 packs/day    Types: Cigarettes  . Smokeless tobacco: Never Used  . Alcohol Use: 0.6 oz/week    1 Glasses of wine per week   OB History    Grav Para Term Preterm Abortions TAB SAB Ect Mult Living   2 0 0 0 2 0 0 2 0 0      Review of Systems  Constitutional: Negative for fever, chills and diaphoresis.  Eyes: Positive for photophobia.  Gastrointestinal: Positive for nausea and vomiting. Negative for abdominal pain.  Neurological: Positive for headaches. Negative for dizziness, speech difficulty, weakness and numbness.  Psychiatric/Behavioral: Negative for confusion.  All other systems reviewed and are negative.    Allergies  Tomato  Home Medications   Current Outpatient Rx  Name  Route  Sig  Dispense  Refill  . aspirin-acetaminophen-caffeine (EXCEDRIN MIGRAINE) 250-250-65 MG per tablet   Oral   Take 1 tablet by mouth every 6 (six) hours as needed for headache.         . Emtricitab-Rilpivir-Tenofovir (COMPLERA) 200-25-300 MG TABS   Oral   Take 1 tablet by mouth daily.   30 tablet   6   . naproxen sodium (ANAPROX) 220 MG tablet   Oral   Take 220 mg by mouth daily as needed (for pain).         . ondansetron (ZOFRAN) 8 MG tablet   Oral   Take 16 mg by mouth once.         . valACYclovir (VALTREX) 1000 MG tablet   Oral   Take 1 tablet (1,000 mg total) by mouth daily.   30 tablet   11    BP 159/109  Pulse 50  Temp(Src) 98.4 F (36.9 C) (Oral)  Resp 16  Ht 5\' 9"  (1.753 m)  Wt 154 lb 6.4 oz (70.035 kg)  BMI 22.79 kg/m2  SpO2 100%  LMP 08/27/2013 Physical Exam  Nursing note and vitals reviewed. Constitutional: She is oriented to person, place, and time. She appears well-developed and well-nourished. No distress.  HENT:  Head: Normocephalic and atraumatic.  Eyes: Conjunctivae and EOM are normal. Pupils are equal, round, and reactive to light.  Neck: Normal range of motion. Neck supple.  No meningeal signs  Cardiovascular: Normal rate and regular rhythm.   No murmur heard. Pulmonary/Chest: Effort normal and breath sounds normal. No respiratory distress. She has no wheezes. She has no rales.   Abdominal: Soft. There is no tenderness. There is no rigidity, no rebound, no guarding, no CVA tenderness and no tenderness at McBurney's point.  Musculoskeletal: Normal range of motion.  Neurological: She is alert and oriented to person, place, and time. She has normal strength. No cranial nerve deficit or sensory deficit. Gait normal.  Skin: Skin is warm and dry. No rash noted.  Psychiatric: She has a normal mood and affect. Her behavior is normal.    ED Course  Procedures   DIAGNOSTIC STUDIES: Oxygen Saturation is 100% on room air.    COORDINATION OF CARE:  Nursing notes reviewed. Vital signs reviewed. Initial pt interview and examination performed.   9:25 PM patient seen and evaluated. Patient appears uncomfortable no acute distress. Headache is similar to previous headaches on the right side. Not relieved today with 2 Excedrin and Aleve. Patient has normal nonfocal neuro exam.  We'll plan to treat with migraine cocktail and reassess.   Treatment plan initiated: Medications  ketorolac (TORADOL) 30 MG/ML injection 30 mg (not administered)  dexamethasone (DECADRON) injection 10 mg (not administered)  diphenhydrAMINE (BENADRYL) injection 25 mg (not administered)  metoCLOPramide (REGLAN) injection 10 mg (not administered)  sodium chloride 0.9 % bolus 1,000 mL (not administered)  ondansetron (ZOFRAN-ODT) disintegrating tablet 8 mg (8 mg Oral Given 10/10/13 1713)    Results for orders placed during the hospital encounter of 10/10/13  CBC WITH DIFFERENTIAL      Result Value Range   WBC 8.3  4.0 - 10.5 K/uL   RBC 4.34  3.87 - 5.11 MIL/uL   Hemoglobin 14.5  12.0 - 15.0 g/dL   HCT 16.1  09.6 - 04.5 %   MCV 92.4  78.0 - 100.0 fL   MCH 33.4  26.0 - 34.0 pg   MCHC 36.2 (*) 30.0 - 36.0 g/dL   RDW 40.9  81.1 - 91.4 %   Platelets 242  150 - 400 K/uL   Neutrophils Relative % 76  43 - 77 %   Neutro Abs 6.4  1.7 - 7.7 K/uL   Lymphocytes Relative 17  12 - 46 %   Lymphs Abs 1.5  0.7 -  4.0 K/uL   Monocytes Relative 4  3 - 12 %   Monocytes Absolute 0.3  0.1 - 1.0 K/uL   Eosinophils Relative 2  0 - 5 %   Eosinophils Absolute 0.1  0.0 - 0.7 K/uL   Basophils Relative 1  0 - 1 %   Basophils Absolute 0.0  0.0 - 0.1 K/uL  BASIC METABOLIC PANEL      Result Value Range   Sodium 141  137 - 147 mEq/L   Potassium 3.8  3.7 - 5.3 mEq/L   Chloride 101  96 - 112 mEq/L   CO2 28  19 - 32 mEq/L   Glucose, Bld 90  70 - 99 mg/dL   BUN 7  6 - 23 mg/dL   Creatinine, Ser 1.61  0.50 - 1.10 mg/dL   Calcium 9.3  8.4 - 09.6 mg/dL   GFR calc non Af Amer >90  >90 mL/min   GFR calc Af Amer >90  >90 mL/min  URINALYSIS, ROUTINE W REFLEX MICROSCOPIC      Result Value Range   Color, Urine YELLOW  YELLOW   APPearance CLEAR  CLEAR   Specific Gravity, Urine 1.020  1.005 - 1.030   pH 8.0  5.0 - 8.0   Glucose, UA NEGATIVE  NEGATIVE mg/dL   Hgb urine dipstick NEGATIVE  NEGATIVE   Bilirubin Urine NEGATIVE  NEGATIVE   Ketones, ur NEGATIVE  NEGATIVE mg/dL   Protein, ur NEGATIVE  NEGATIVE mg/dL   Urobilinogen, UA 0.2  0.0 - 1.0 mg/dL   Nitrite NEGATIVE  NEGATIVE   Leukocytes, UA NEGATIVE  NEGATIVE  POCT PREGNANCY, URINE      Result Value Range   Preg Test, Ur NEGATIVE  NEGATIVE     Imaging Review No results found.  Patient significantly better after a migraine cocktail. Patient is ready to return home at this time requesting to be discharged.  MDM   1. Headache         Angus Seller, PA-C 10/10/13 2242

## 2013-10-12 ENCOUNTER — Telehealth: Payer: Self-pay

## 2013-10-12 NOTE — Telephone Encounter (Signed)
Patient is calling for office for appointment.   Problems with migraines and current medications are not working.  Appointment given.   Laurell Josephsammy K Leea Rambeau, RN

## 2013-10-13 ENCOUNTER — Ambulatory Visit (INDEPENDENT_AMBULATORY_CARE_PROVIDER_SITE_OTHER): Payer: Self-pay | Admitting: Infectious Disease

## 2013-10-13 ENCOUNTER — Encounter: Payer: Self-pay | Admitting: Infectious Disease

## 2013-10-13 VITALS — BP 152/111 | HR 58 | Temp 97.8°F | Wt 160.0 lb

## 2013-10-13 DIAGNOSIS — B20 Human immunodeficiency virus [HIV] disease: Secondary | ICD-10-CM

## 2013-10-13 DIAGNOSIS — G43909 Migraine, unspecified, not intractable, without status migrainosus: Secondary | ICD-10-CM | POA: Insufficient documentation

## 2013-10-13 MED ORDER — ONDANSETRON HCL 4 MG PO TABS: 4.0000 mg | ORAL_TABLET | Freq: Four times a day (QID) | ORAL | Status: DC | PRN

## 2013-10-13 MED ORDER — AMITRIPTYLINE HCL 50 MG PO TABS: 50.0000 mg | ORAL_TABLET | Freq: Every day | ORAL | Status: DC

## 2013-10-13 NOTE — Progress Notes (Signed)
  [facility] - Pharmacist    HPI: Tiffany Morrison is a 28 y.o. female here for follow-up after an ER visit for a migraine headache. She reports significant NSAID use without relief.  Allergies: Allergies  Allergen Reactions  . Tomato Hives    Vitals: Temp: 97.8 F (36.6 C) (01/13 1035) Temp src: Oral (01/13 1035) BP: 152/111 mmHg (01/13 1035) Pulse Rate: 58 (01/13 1035)  Past Medical History: Past Medical History  Diagnosis Date  . Recurrent boils   . Abnormal Pap smear and cervical HPV (human papillomavirus) 2011    colposcopy   . Genital herpes 09/14/2011  . HIV disease     Social History: History   Social History  . Marital Status: Single    Spouse Name: N/A    Number of Children: N/A  . Years of Education: N/A   Social History Main Topics  . Smoking status: Current Every Day Smoker -- 0.25 packs/day    Types: Cigarettes  . Smokeless tobacco: Never Used  . Alcohol Use: 0.6 oz/week    1 Glasses of wine per week  . Drug Use: Yes    Special: Marijuana  . Sexual Activity: Yes    Birth Control/ Protection: Condom     Comment: given condoms   Other Topics Concern  . None   Social History Narrative  . None    Current Regimen: Complera  Labs: HIV 1 RNA Quant (copies/mL)  Date Value  08/17/2013 <20   04/30/2013 <20   01/01/2013 38*     CD4 T Cell Abs (/uL)  Date Value  08/17/2013 600   04/30/2013 420   01/01/2013 380*     Hep B S Ab (no units)  Date Value  10/04/2011 POS*     Hepatitis B Surface Ag (no units)  Date Value  10/04/2011 NEGATIVE      HCV Ab (no units)  Date Value  10/04/2011 NEGATIVE     CrCl: The CrCl is unknown because both a height and weight (above a minimum accepted value) are required for this calculation.  Lipids:    Component Value Date/Time   CHOL 198 01/01/2013 1155   TRIG 86 01/01/2013 1155   HDL 36* 01/01/2013 1155   CHOLHDL 5.5 01/01/2013 1155   VLDL 17 01/01/2013 1155   LDLCALC 145*  01/01/2013 1155    Assessment: She reports intermittent migraine headaches with months that elapse between attacks. However her attacks are severe and most recently required an ER visit.  Her migraines do not respond to NSAIDs.  Unfortunately ADAP does not cover triptans.  Recommendations: Will start Amitriptyline for migraine prevention. As nausea is one of her presenting complaints with her migraines will also prescribe Zofran (she responded to this in the past). May also consider using Reglan 10mg  PO q6h as needed if she fails Amitriptyline/Zofran.  Sallee Provencalurner, Anira Sen[place] S, Pharm.D., BCPS, AAHIVP Clinical Infectious Disease Pharmacist [facility] 10/13/2013, 2:15 PM

## 2013-10-13 NOTE — Progress Notes (Signed)
Subjective:    Patient ID: Tiffany Morrison, female    DOB: [birth year], 28 y.o.   MRN:Tiffany Fruit [id number]  Migraine  Associated symptoms include nausea and photophobia. Pertinent negatives include no abdominal pain, back pain, coughing, dizziness, fever, rhinorrhea, sinus pressure, sore throat, vomiting or weakness.    Tiffany Morrison is a 28 y.o. female with HIV infection who is doing superbly well on her  antiviral regimen, complera with undetectable viral load and health cd4 count.   She returns today for problems with migrainous headaches. She has had them prior to using her current ARVs. They worsened abruptly af few days ago and became so severe that she sought care in the ED where she was given a dose of IV steroids, zofran and reglan with relief of her headaches. She says that headaches returned the following day. She has negligible relief from mobic but better relief from crushed ibuprofen but she is worried for risk of developing an ulcer from NSAIds.   I have reviewed with Tiffany HuskMichelle Morrison from pharmacy options covered for ADAp and we  Will start her on amitryptiline 50mg  qhs to help with HA prophylaxis and for pain modulation and zofran for nausea. She finds no relief from excedrin migraine   Review of Systems  Constitutional: Negative for fever, chills, diaphoresis, activity change, appetite change, fatigue and unexpected weight change.  HENT: Negative for congestion, rhinorrhea, sinus pressure, sneezing, sore throat and trouble swallowing.   Eyes: Positive for photophobia and visual disturbance.  Respiratory: Negative for cough, chest tightness, shortness of breath, wheezing and stridor.   Cardiovascular: Negative for chest pain, palpitations and leg swelling.  Gastrointestinal: Positive for nausea. Negative for vomiting, abdominal pain, diarrhea, constipation, blood in stool, abdominal distention and anal bleeding.  Genitourinary: Negative for dysuria, hematuria, flank pain and  difficulty urinating.  Musculoskeletal: Negative for arthralgias, back pain, gait problem, joint swelling and myalgias.  Skin: Negative for color change, pallor, rash and wound.  Neurological: Positive for headaches. Negative for dizziness, tremors, weakness and light-headedness.  Hematological: Negative for adenopathy. Does not bruise/bleed easily.  Psychiatric/Behavioral: Negative for behavioral problems, confusion, sleep disturbance, dysphoric mood, decreased concentration and agitation.       Objective:   Physical Exam  Constitutional: She is oriented to person, place, and time. She appears well-developed and well-nourished. No distress.  HENT:  Head: Normocephalic and atraumatic.  Mouth/Throat: Oropharynx is clear and moist. No oropharyngeal exudate.  Eyes: Conjunctivae and EOM are normal. No scleral icterus.  Neck: Normal range of motion. Neck supple. No JVD present.  Cardiovascular: Normal rate, regular rhythm and normal heart sounds.  Exam reveals no gallop and no friction rub.   No murmur heard. Pulmonary/Chest: Effort normal and breath sounds normal. No respiratory distress. She has no wheezes.  Abdominal: She exhibits no distension. There is no tenderness. There is no rebound.  Musculoskeletal: She exhibits no edema and no tenderness.  Lymphadenopathy:    She has no cervical adenopathy.  Neurological: She is alert and oriented to person, place, and time. She exhibits normal muscle tone. Coordination normal.  Skin: Skin is warm and dry. She is not diaphoretic. No erythema. No pallor.  Psychiatric: She has a normal mood and affect. Her behavior is normal. Judgment and thought content normal.          Assessment & Plan:   Migraine ha: start amitriptyline 50mg  qhs and zofran prn. May consider reglan. Would like to get her into Neurology [facility] clinic. Perhaps she could get patient  assistance for imitrex?? I spent greater than 25 minutes with the patient including greater than  50% of time in face to face counsel of the patient and in coordination of their care.    HIV: continue complera. She understands food requirements. Emphasized NO PPI and avoidance of antacids as well and timing of h2 blockers tums if they are needed.

## 2013-10-19 ENCOUNTER — Telehealth: Payer: Self-pay | Admitting: *Deleted

## 2013-10-19 NOTE — Telephone Encounter (Signed)
[facility] for shoulder pain.  Pt not ready to go to appt. Has a conflict. Needs to speak with Financial Counselor prior to making and keeping orthopedic appt.  Phone numbers given for Outpatient Clinic and Financial Counselor, [employer].  Pt verbalized that she would call the Clinic.  RN advised that if she did not reschedule the appt she may be charged for a missed appt and not given another appt in the future.  Pt verbalized understanding.

## 2013-11-09 ENCOUNTER — Other Ambulatory Visit: Payer: Self-pay | Admitting: *Deleted

## 2013-11-09 DIAGNOSIS — B20 Human immunodeficiency virus [HIV] disease: Secondary | ICD-10-CM

## 2013-11-09 MED ORDER — EMTRICITAB-RILPIVIR-TENOFOV DF 200-25-300 MG PO TABS: 1.0000 | ORAL_TABLET | Freq: Every day | ORAL | Status: DC

## 2013-11-16 ENCOUNTER — Other Ambulatory Visit: Payer: Self-pay | Admitting: *Deleted

## 2013-11-16 DIAGNOSIS — B20 Human immunodeficiency virus [HIV] disease: Secondary | ICD-10-CM

## 2013-11-16 MED ORDER — EMTRICITAB-RILPIVIR-TENOFOV DF 200-25-300 MG PO TABS: 1.0000 | ORAL_TABLET | Freq: Every day | ORAL | Status: DC

## 2013-12-22 ENCOUNTER — Encounter: Payer: Self-pay | Admitting: Internal Medicine

## 2013-12-22 ENCOUNTER — Ambulatory Visit (INDEPENDENT_AMBULATORY_CARE_PROVIDER_SITE_OTHER): Payer: Self-pay | Admitting: Internal Medicine

## 2013-12-22 VITALS — BP 165/117 | HR 102 | Temp 98.1°F | Wt 170.0 lb

## 2013-12-22 DIAGNOSIS — B009 Herpesviral infection, unspecified: Secondary | ICD-10-CM

## 2013-12-22 DIAGNOSIS — B20 Human immunodeficiency virus [HIV] disease: Secondary | ICD-10-CM

## 2013-12-22 LAB — COMPLETE METABOLIC PANEL WITH GFR
ALK PHOS: 66 U/L (ref 39–117)
ALT: 14 U/L (ref 0–35)
AST: 21 U/L (ref 0–37)
Albumin: 4.4 g/dL (ref 3.5–5.2)
BILIRUBIN TOTAL: 0.4 mg/dL (ref 0.2–1.2)
BUN: 7 mg/dL (ref 6–23)
CO2: 29 mEq/L (ref 19–32)
Calcium: 9.5 mg/dL (ref 8.4–10.5)
Chloride: 101 mEq/L (ref 96–112)
Creat: 0.74 mg/dL (ref 0.50–1.10)
GFR, Est African American: >89 mL/min
Glucose, Bld: 77 mg/dL (ref 70–99)
Potassium: 3.7 mEq/L (ref 3.5–5.3)
SODIUM: 139 meq/L (ref 135–145)
TOTAL PROTEIN: 7.7 g/dL (ref 6.0–8.3)

## 2013-12-22 LAB — CBC WITH DIFFERENTIAL/PLATELET
BASOS PCT: 1 % (ref 0–1)
Basophils Absolute: 0.1 10*3/uL (ref 0.0–0.1)
Eosinophils Absolute: 0.3 10*3/uL (ref 0.0–0.7)
Eosinophils Relative: 4 % (ref 0–5)
HEMATOCRIT: 37 % (ref 36.0–46.0)
HEMOGLOBIN: 12.6 g/dL (ref 12.0–15.0)
Lymphocytes Relative: 31 % (ref 12–46)
Lymphs Abs: 2.2 10*3/uL (ref 0.7–4.0)
MCH: 31.8 pg (ref 26.0–34.0)
MCHC: 34.1 g/dL (ref 30.0–36.0)
MCV: 93.4 fL (ref 78.0–100.0)
Monocytes Absolute: 0.4 10*3/uL (ref 0.1–1.0)
Monocytes Relative: 6 % (ref 3–12)
NEUTROS PCT: 58 % (ref 43–77)
Neutro Abs: 4.1 10*3/uL (ref 1.7–7.7)
PLATELETS: 276 10*3/uL (ref 150–400)
RBC: 3.96 MIL/uL (ref 3.87–5.11)
RDW: 13.8 % (ref 11.5–15.5)
WBC: 7.1 10*3/uL (ref 4.0–10.5)

## 2013-12-22 NOTE — Progress Notes (Signed)
   Subjective:    Patient ID: Tiffany Morrison, female    DOB: 1985/12/10, 28 y.o.   MRN: [id number]  HPI 27yo F with HIV ,CD 4 count of 600/VL<20, on complera and valtrex for hsv proph . Doing well. She thought she may have had small hsv outbreak lasting 1-2 days but did not evolve into ulcers. She said she treated with topical alcohol. She is doing well otherwise, no issues with her health  Had pap that showed LSIL in 2014  Current Outpatient Prescriptions on File Prior to Visit  Medication Sig Dispense Refill  . amitriptyline (ELAVIL) 50 MG tablet Take 1 tablet (50 mg total) by mouth at bedtime.  30 tablet  11  . aspirin-acetaminophen-caffeine (EXCEDRIN MIGRAINE) 250-250-65 MG per tablet Take 1 tablet by mouth every 6 (six) hours as needed for headache.      . Emtricitab-Rilpivir-Tenofovir (COMPLERA) 200-25-300 MG TABS Take 1 tablet by mouth daily.  30 tablet  6  . ondansetron (ZOFRAN) 4 MG tablet Take 1 tablet (4 mg total) by mouth 4 (four) times daily as needed for nausea or vomiting.  120 tablet  11  . valACYclovir (VALTREX) 1000 MG tablet Take 1 tablet (1,000 mg total) by mouth daily.  30 tablet  11   No current facility-administered medications on file prior to visit.   Active Ambulatory Problems    Diagnosis Date Noted  . Genital herpes 09/14/2011  . Abnormal Pap smear of cervix 10/09/2011  . HIV infection 10/09/2011  . Viral warts 12/25/2011  . 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 Migraine headache 10/13/2013   Resolved Ambulatory Problems    Diagnosis Date Noted  . No Resolved Ambulatory Problems   Past Medical History  Diagnosis Date  . Recurrent boils   . Abnormal Pap smear and cervical HPV (human papillomavirus) 2011  . HIV disease       Review of Systems 10 point ROS is negative    Objective:   Physical Exam BP 165/117  Pulse 102  Temp(Src) 98.1 F (36.7 C) (Oral)  Wt 170 lb (77.111 kg)  LMP 12/14/2013 Physical  Exam  Constitutional:  oriented to person, place, and time. appears well-developed and well-nourished. No distress.  HENT:  Mouth/Throat: Oropharynx is clear and moist. No oropharyngeal exudate. No skin lesions Lymphadenopathy: no cervical adenopathy.  Neurological: alert and oriented to person, place, and time.  Skin: Skin is warm and dry. No rash noted. No erythema.  Psychiatric: a normal mood and affect.  behavior is normal.         Assessment & Plan:  HIV =well controlled. 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 Will need to set her up again for Pap visit  hsv genital herpes = continue with daily valtrex for suppression  rtc in 3 months

## 2013-12-23 LAB — T-HELPER CELL (CD4) - (RCID CLINIC ONLY)
CD4 T CELL ABS: 540 /uL (ref 400–2700)
CD4 T CELL HELPER: 24 % — AB (ref 33–55)

## 2013-12-25 ENCOUNTER — Ambulatory Visit (INDEPENDENT_AMBULATORY_CARE_PROVIDER_SITE_OTHER): Payer: Self-pay | Admitting: *Deleted

## 2013-12-25 DIAGNOSIS — R87610 Atypical squamous cells of undetermined significance on cytologic smear of cervix (ASC-US): Secondary | ICD-10-CM

## 2013-12-25 DIAGNOSIS — Z124 Encounter for screening for malignant neoplasm of cervix: Secondary | ICD-10-CM

## 2013-12-25 LAB — HIV-1 RNA QUANT-NO REFLEX-BLD
HIV 1 RNA Quant: 44 copies/mL — ABNORMAL HIGH (ref <20)
HIV-1 RNA Quant, Log: 1.64 {Log} — ABNORMAL HIGH (ref <1.30)

## 2013-12-25 NOTE — Progress Notes (Signed)
  Subjective:     Tiffany Morrison is a 28 y.o. woman who comes in today for a  pap smear only.  Previous abnormal Pap smears: yes. Contraception:condoms,  Objective:    LMP 12/14/2013, slight whitish discharge in vaginal canal./ Pelvic Exam: Pap smear obtained.   Assessment:    Screening pap smear.   Plan:    Follow up in one year, or as indicated by Pap results.  Pt given educational materials re: HIV and women, self-esteem, BSE, nutrition and diet management, PAP smears and partner safety. Pt given condoms.

## 2013-12-31 ENCOUNTER — Telehealth: Payer: Self-pay | Admitting: *Deleted

## 2013-12-31 NOTE — Telephone Encounter (Signed)
Pt verbalized back understanding.  [facility] than a month [facility]  She will be contacted with appt information.

## 2013-12-31 NOTE — Addendum Note (Signed)
Addended by: Jennet MaduroESTRIDGE, Amelia Macken D on: 12/31/2013 12:05 PM   Modules accepted: Orders

## 2014-01-21 ENCOUNTER — Encounter: Payer: Self-pay | Admitting: Family Medicine

## 2014-03-15 ENCOUNTER — Encounter: Payer: Self-pay | Admitting: Family Medicine

## 2014-03-29 ENCOUNTER — Other Ambulatory Visit (INDEPENDENT_AMBULATORY_CARE_PROVIDER_SITE_OTHER): Payer: Self-pay

## 2014-03-29 ENCOUNTER — Telehealth: Payer: Self-pay

## 2014-03-29 DIAGNOSIS — Z113 Encounter for screening for infections with a predominantly sexual mode of transmission: Secondary | ICD-10-CM

## 2014-03-29 DIAGNOSIS — Z79899 Other long term (current) drug therapy: Secondary | ICD-10-CM

## 2014-03-29 DIAGNOSIS — B20 Human immunodeficiency virus [HIV] disease: Secondary | ICD-10-CM

## 2014-03-29 NOTE — Telephone Encounter (Signed)
Pt walked into clinic and asked to speak with me.  Patient  is now ready for assistance with cocaine use. She remembers I spoke with her earlier in the year and suggested she seek counseling  services for possible substance abuse. I shared how  increased use that started as "just getting high" can turn into addiction. She does not want to wait until "she hits rock bottom". Her use is now taking over her life and she does not want to live like this anymore.  I told her how proud I was that she made the decision to seek help.  She made the first step and is now eager to begin.   I have scheduled  her for a next day appointment with ADS counselor who will discuss options available .  Hopefully she will keep this appointment.   Laurell Josephsammy K King, RN

## 2014-03-30 ENCOUNTER — Ambulatory Visit: Payer: Self-pay

## 2014-03-30 ENCOUNTER — Ambulatory Visit: Payer: Self-pay | Admitting: *Deleted

## 2014-03-30 DIAGNOSIS — F1410 Cocaine abuse, uncomplicated: Secondary | ICD-10-CM

## 2014-03-30 LAB — CBC WITH DIFFERENTIAL/PLATELET
BASOS PCT: 1 % (ref 0–1)
Basophils Absolute: 0.1 10*3/uL (ref 0.0–0.1)
Eosinophils Absolute: 0.3 10*3/uL (ref 0.0–0.7)
Eosinophils Relative: 4 % (ref 0–5)
HCT: 37 % (ref 36.0–46.0)
Hemoglobin: 12.8 g/dL (ref 12.0–15.0)
Lymphocytes Relative: 36 % (ref 12–46)
Lymphs Abs: 2.8 10*3/uL (ref 0.7–4.0)
MCH: 31.8 pg (ref 26.0–34.0)
MCHC: 34.6 g/dL (ref 30.0–36.0)
MCV: 92 fL (ref 78.0–100.0)
MONOS PCT: 6 % (ref 3–12)
Monocytes Absolute: 0.5 10*3/uL (ref 0.1–1.0)
NEUTROS ABS: 4.1 10*3/uL (ref 1.7–7.7)
Neutrophils Relative %: 53 % (ref 43–77)
Platelets: 266 10*3/uL (ref 150–400)
RBC: 4.02 MIL/uL (ref 3.87–5.11)
RDW: 14.4 % (ref 11.5–15.5)
WBC: 7.7 10*3/uL (ref 4.0–10.5)

## 2014-03-30 LAB — COMPREHENSIVE METABOLIC PANEL
ALT: 12 U/L (ref 0–35)
AST: 21 U/L (ref 0–37)
Albumin: 4.4 g/dL (ref 3.5–5.2)
Alkaline Phosphatase: 55 U/L (ref 39–117)
BUN: 10 mg/dL (ref 6–23)
CO2: 28 mEq/L (ref 19–32)
Calcium: 9.4 mg/dL (ref 8.4–10.5)
Chloride: 103 mEq/L (ref 96–112)
Creat: 0.93 mg/dL (ref 0.50–1.10)
GLUCOSE: 99 mg/dL (ref 70–99)
Potassium: 3.4 mEq/L — ABNORMAL LOW (ref 3.5–5.3)
Sodium: 142 mEq/L (ref 135–145)
Total Bilirubin: 0.5 mg/dL (ref 0.2–1.2)
Total Protein: 7.7 g/dL (ref 6.0–8.3)

## 2014-03-30 LAB — LIPID PANEL
CHOL/HDL RATIO: 4.4 ratio
Cholesterol: 174 mg/dL (ref 0–200)
HDL: 40 mg/dL (ref >39)
LDL Cholesterol: 119 mg/dL — ABNORMAL HIGH (ref 0–99)
Triglycerides: 73 mg/dL (ref <150)
VLDL: 15 mg/dL (ref 0–40)

## 2014-03-30 LAB — T-HELPER CELL (CD4) - (RCID CLINIC ONLY)
CD4 % Helper T Cell: 28 % — ABNORMAL LOW (ref 33–55)
CD4 T CELL ABS: 810 /uL (ref 400–2700)

## 2014-03-30 LAB — RPR

## 2014-03-30 NOTE — Progress Notes (Signed)
Patient ID: Tiffany Morrison, female   DOB: May 28, 1986, 28 y.o.   MRN: [id number]  COUNSELOR MET WITH CLIENT TODAY.  CLIENT WAS A BIT STRESSED UPON ARRIVING AS A RESULT OF SLEEPING THROUGH HER ALARM.  CLIENT WAS ORIENTED X FOUR AND WAS WELL GROOMED.  CLIENT WAS IN GOOD SPIRITS AND SHARED HER GOAL WITH WANTING TO GET CLEAN ONCE AND FOR ALL.  CLIENT COMMUNICATED THAT SHE JUST TURNED 28 AND WANTED TO START THIS NEXT TEN YEARS ON THE RIGHT TRACK AND BEING COMPLETELY CLEAN FROM USING DRUGS.  CLIENT COMMUNICATED THAT SHE WANTED TO MEET ON A REGULAR BASIS WITH SUBSTANCE ABUSE COUNSELOR. COUNSELOR PROVIDED SUPPORT AND UNDERSTANDING AS NEEDED FOR CLIENT.  COUNSELOR ENCOURAGED CLIENT TO ATTEND SESSIONS ON A REGULAR BASIS IN ORDER TO SET UP A GOOD HABIT MOVING FORWARD WITH HER RECOVERY. CLIENT AGREED TO MEET ONCE A WEEK WITH COUNSELOR FOR NOW AND WOULD REVISIT THE FREQUENCY LATER. CLIENT WAS OPEN AND HONEST TODAY AS SHE SHARED.  COUNSELOR SENSED A BIT OF IGNORANCE ON CLIENT'S PART WITH REGARD TO THE POSSIBLE DIFFICULTIES SHE MAY FACE WHILE IN RECOVERY.  REGARDLESS, CLIENT WAS MOTIVATED AND EVEN A BIT ANXIOUS ABOUT GETTING STARTED.  JODI HERRING, LPCA, MA [facility]

## 2014-04-01 ENCOUNTER — Other Ambulatory Visit: Payer: Self-pay | Admitting: *Deleted

## 2014-04-01 DIAGNOSIS — B20 Human immunodeficiency virus [HIV] disease: Secondary | ICD-10-CM

## 2014-04-01 DIAGNOSIS — Z21 Asymptomatic human immunodeficiency virus [HIV] infection status: Secondary | ICD-10-CM

## 2014-04-01 MED ORDER — EMTRICITAB-RILPIVIR-TENOFOV DF 200-25-300 MG PO TABS: 1.0000 | ORAL_TABLET | Freq: Every day | ORAL | Status: DC

## 2014-04-02 LAB — HIV-1 RNA QUANT-NO REFLEX-BLD: HIV 1 RNA Quant: <20 copies/mL (ref <20)

## 2014-04-14 ENCOUNTER — Ambulatory Visit: Payer: Self-pay | Admitting: *Deleted

## 2014-04-19 ENCOUNTER — Encounter: Payer: Self-pay | Admitting: Medical

## 2014-04-21 ENCOUNTER — Ambulatory Visit: Payer: Self-pay | Admitting: *Deleted

## 2014-04-22 ENCOUNTER — Encounter: Payer: Self-pay | Admitting: Nurse Practitioner

## 2014-04-22 ENCOUNTER — Encounter: Payer: Self-pay | Admitting: *Deleted

## 2014-04-29 ENCOUNTER — Ambulatory Visit: Payer: Self-pay | Admitting: *Deleted

## 2014-05-17 ENCOUNTER — Other Ambulatory Visit (HOSPITAL_COMMUNITY)
Admission: RE | Admit: 2014-05-17 | Discharge: 2014-05-17 | Disposition: A | Discharge status: Home / Self Care | Payer: Self-pay | Source: Ambulatory Visit | Attending: Medical | Admitting: Medical

## 2014-05-17 ENCOUNTER — Ambulatory Visit (INDEPENDENT_AMBULATORY_CARE_PROVIDER_SITE_OTHER): Payer: Self-pay | Admitting: Medical

## 2014-05-17 ENCOUNTER — Encounter: Payer: Self-pay | Admitting: Medical

## 2014-05-17 VITALS — BP 138/85 | HR 61 | Ht 69.0 in | Wt 157.7 lb

## 2014-05-17 DIAGNOSIS — R87610 Atypical squamous cells of undetermined significance on cytologic smear of cervix (ASC-US): Secondary | ICD-10-CM | POA: Insufficient documentation

## 2014-05-17 DIAGNOSIS — IMO0002 Reserved for concepts with insufficient information to code with codable children: Secondary | ICD-10-CM

## 2014-05-17 DIAGNOSIS — R6889 Other general symptoms and signs: Secondary | ICD-10-CM

## 2014-05-17 DIAGNOSIS — B977 Papillomavirus as the cause of diseases classified elsewhere: Secondary | ICD-10-CM | POA: Insufficient documentation

## 2014-05-17 DIAGNOSIS — Z01812 Encounter for preprocedural laboratory examination: Secondary | ICD-10-CM

## 2014-05-17 LAB — POCT PREGNANCY, URINE: Preg Test, Ur: NEGATIVE

## 2014-05-17 NOTE — Progress Notes (Signed)
    GYNECOLOGY CLINIC COLPOSCOPY PROCEDURE NOTE  28 y.o. [id number] here for colposcopy for ASCUS with POSITIVE high risk HPV pap smear on 12/25/13. Discussed role for HPV in cervical dysplasia, need for surveillance.  Patient given informed consent, signed copy in the chart, time out was performed.  Placed in lithotomy position. Cervix viewed with speculum and colposcope after application of acetic acid.   Colposcopy adequate? Yes  acetowhite lesion(s) noted at 12 o'clock and 11 o'clock; biopsies obtained.  ECC specimen obtained. All specimens were labelled and sent to pathology.  Patient was given post procedure instructions.  Will follow up pathology and manage accordingly.  Routine preventative health maintenance measures emphasized.  Dr. Erin FullingHarraway-Smith present for procedure  Tiffany StarrJulie N Ethier, PA-C 05/17/2014 1:56 PM

## 2014-05-17 NOTE — Patient Instructions (Signed)
Colposcopy Care After Colposcopy is a procedure in which a special tool is used to magnify the surface of the cervix. A tissue sample (biopsy) may also be taken. This sample will be looked at for cervical cancer or other problems. After the test:  You may have some cramping.  Lie down for a few minutes if you feel lightheaded.   You may have some bleeding which should stop in a few days. HOME CARE  Do not have sex or use tampons for 2 to 3 days or as told.  Only take medicine as told by your doctor.  Continue to take your birth control pills as usual. Finding out the results of your test Ask when your test results will be ready. Make sure you get your test results. GET HELP RIGHT AWAY IF:  You are bleeding a lot or are passing blood clots.  You develop a fever of 102 F (38.9 C) or higher.  You have abnormal vaginal discharge.  You have cramps that do not go away with medicine.  You feel lightheaded, dizzy, or pass out (faint). MAKE SURE YOU:   Understand these instructions.  Will watch your condition.  Will get help right away if you are not doing well or get worse. Document Released: 03/05/2008 Document Revised: 12/10/2011 Document Reviewed: 04/16/2013 ExitCare Patient Information 2015 [employer]. This information is not intended to replace advice given to you by your health care provider. Make sure you discuss any questions you have with your health care provider.  

## 2014-05-21 ENCOUNTER — Encounter: Payer: Self-pay | Admitting: General Practice

## 2014-05-24 ENCOUNTER — Encounter: Payer: Self-pay | Admitting: Internal Medicine

## 2014-05-24 ENCOUNTER — Ambulatory Visit (INDEPENDENT_AMBULATORY_CARE_PROVIDER_SITE_OTHER): Payer: Self-pay | Admitting: Internal Medicine

## 2014-05-24 VITALS — BP 143/103 | HR 59 | Temp 98.3°F | Wt 157.0 lb

## 2014-05-24 DIAGNOSIS — L708 Other acne: Secondary | ICD-10-CM

## 2014-05-24 DIAGNOSIS — Z23 Encounter for immunization: Secondary | ICD-10-CM

## 2014-05-24 DIAGNOSIS — L709 Acne, unspecified: Secondary | ICD-10-CM

## 2014-05-24 MED ORDER — CLINDAMYCIN PHOSPHATE 1 % EX LOTN: TOPICAL_LOTION | Freq: Two times a day (BID) | CUTANEOUS | Status: DC

## 2014-05-24 NOTE — Progress Notes (Signed)
Patient ID: Tiffany Morrison, female   DOB: [birth year], 28 y.o.   MRN: [id number]       Patient ID: Tiffany Morrison, female   DOB: [birth year], 28 y.o.   MRN: [id number]  HPI 28 yo F with HIV disease, well controlled, 810/VL<20 on complera. Underwent cervical biopsy last week. Showed signs of atypical cells c/w hpv, recommended to do retesting in 12 months.   Outpatient Encounter Prescriptions as of 05/24/2014  Medication Sig  . amitriptyline (ELAVIL) 50 MG tablet Take 1 tablet (50 mg total) by mouth at bedtime.  Marland Kitchen aspirin-acetaminophen-caffeine (EXCEDRIN MIGRAINE) 250-250-65 MG per tablet Take 1 tablet by mouth every 6 (six) hours as needed for headache.  . Emtricitab-Rilpivir-Tenofovir (COMPLERA) 200-25-300 MG TABS Take 1 tablet by mouth daily.  . ondansetron (ZOFRAN) 4 MG tablet Take 1 tablet (4 mg total) by mouth 4 (four) times daily as needed for nausea or vomiting.  . valACYclovir (VALTREX) 1000 MG tablet Take 1 tablet (1,000 mg total) by mouth daily.     Patient Active Problem List   Diagnosis Date Noted  . Migraine headache 10/13/2013  . 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 Viral warts 12/25/2011  . Abnormal Pap smear of cervix 10/09/2011  . HIV infection 10/09/2011  . Genital herpes 09/14/2011     Health Maintenance Due  Topic Date Due  . Tetanus/tdap  03/11/2003  . Influenza Vaccine  05/01/2014     Review of Systems + prolonged menses this cycle. Acne/rash to chin Physical Exam   BP 143/103  Pulse 59  Temp(Src) 98.3 F (36.8 C) (Oral)  Wt 157 lb (71.215 kg)  LMP 05/16/2014 Physical Exam  Constitutional:  oriented to person, place, and time. appears well-developed and well-nourished. No distress.  HENT:  Mouth/Throat: Oropharynx is clear and moist. No oropharyngeal exudate.  Cardiovascular: Normal rate, regular rhythm and normal heart sounds. Exam reveals no gallop and no friction rub.  No murmur heard.  Pulmonary/Chest:  Effort normal and breath sounds normal. No respiratory distress.  has no wheezes.  Lymphadenopathy: no cervical adenopathy.  Neurological: alert and oriented to person, place, and time.  Skin: Skin is warm and dry. Rash noted to chin and peri-oral region. Small clusters of papule Psychiatric: a normal mood and affect.  behavior is normal.   Lab Results  Component Value Date   CD4TCELL 28* 03/29/2014   Lab Results  Component Value Date   CD4TABS 810 03/29/2014   CD4TABS 540 12/22/2013   CD4TABS 600 08/17/2013   Lab Results  Component Value Date   HIV1RNAQUANT <20 03/29/2014   Lab Results  Component Value Date   HEPBSAB POS* 10/04/2011   No results found for this basename: RPR    CBC Lab Results  Component Value Date   WBC 7.7 03/29/2014   RBC 4.02 03/29/2014   HGB 12.8 03/29/2014   HCT 37.0 03/29/2014   PLT 266 03/29/2014   MCV 92.0 03/29/2014   MCH 31.8 03/29/2014   MCHC 34.6 03/29/2014   RDW 14.4 03/29/2014   LYMPHSABS 2.8 03/29/2014   MONOABS 0.5 03/29/2014   EOSABS 0.3 03/29/2014   BASOSABS 0.1 03/29/2014   BMET Lab Results  Component Value Date   NA 142 03/29/2014   K 3.4* 03/29/2014   CL 103 03/29/2014   CO2 28 03/29/2014   GLUCOSE 99 03/29/2014   BUN 10 03/29/2014   CREATININE 0.93 03/29/2014   CALCIUM 9.4 03/29/2014   GFRNONAA >89 12/22/2013   GFRAA >  89 12/22/2013     Assessment and Plan  hiv = well controlled on complera  Acne ? Vs. 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 Needs to do redo testing in 12 months  Health maintenance = will do flu shot today

## 2014-05-25 ENCOUNTER — Ambulatory Visit: Payer: Self-pay | Admitting: Internal Medicine

## 2014-06-03 ENCOUNTER — Ambulatory Visit: Payer: Self-pay

## 2014-06-08 ENCOUNTER — Other Ambulatory Visit: Payer: Self-pay | Admitting: Internal Medicine

## 2014-06-09 ENCOUNTER — Ambulatory Visit: Payer: Self-pay

## 2014-06-10 ENCOUNTER — Ambulatory Visit: Payer: Self-pay

## 2014-06-29 ENCOUNTER — Ambulatory Visit: Payer: Self-pay

## 2014-07-10 ENCOUNTER — Other Ambulatory Visit: Payer: Self-pay | Admitting: Internal Medicine

## 2014-08-02 ENCOUNTER — Encounter: Payer: Self-pay | Admitting: Internal Medicine

## 2014-09-17 ENCOUNTER — Other Ambulatory Visit (INDEPENDENT_AMBULATORY_CARE_PROVIDER_SITE_OTHER): Payer: Self-pay

## 2014-09-17 DIAGNOSIS — Z113 Encounter for screening for infections with a predominantly sexual mode of transmission: Secondary | ICD-10-CM

## 2014-09-17 DIAGNOSIS — B20 Human immunodeficiency virus [HIV] disease: Secondary | ICD-10-CM

## 2014-09-17 LAB — CBC WITH DIFFERENTIAL/PLATELET
BASOS ABS: 0.1 10*3/uL (ref 0.0–0.1)
BASOS PCT: 1 % (ref 0–1)
EOS PCT: 9 % — AB (ref 0–5)
Eosinophils Absolute: 0.5 10*3/uL (ref 0.0–0.7)
HCT: 36.4 % (ref 36.0–46.0)
HEMOGLOBIN: 12.3 g/dL (ref 12.0–15.0)
Lymphocytes Relative: 31 % (ref 12–46)
Lymphs Abs: 1.7 10*3/uL (ref 0.7–4.0)
MCH: 31.9 pg (ref 26.0–34.0)
MCHC: 33.8 g/dL (ref 30.0–36.0)
MCV: 94.3 fL (ref 78.0–100.0)
MPV: 10 fL (ref 9.4–12.4)
Monocytes Absolute: 0.4 10*3/uL (ref 0.1–1.0)
Monocytes Relative: 7 % (ref 3–12)
Neutro Abs: 2.9 10*3/uL (ref 1.7–7.7)
Neutrophils Relative %: 52 % (ref 43–77)
PLATELETS: 302 10*3/uL (ref 150–400)
RBC: 3.86 MIL/uL — ABNORMAL LOW (ref 3.87–5.11)
RDW: 12.4 % (ref 11.5–15.5)
WBC: 5.5 10*3/uL (ref 4.0–10.5)

## 2014-09-17 LAB — BASIC METABOLIC PANEL
BUN: 8 mg/dL (ref 6–23)
CALCIUM: 9.4 mg/dL (ref 8.4–10.5)
CO2: 28 meq/L (ref 19–32)
CREATININE: 0.96 mg/dL (ref 0.50–1.10)
Chloride: 100 mEq/L (ref 96–112)
Glucose, Bld: 79 mg/dL (ref 70–99)
Potassium: 3.4 mEq/L — ABNORMAL LOW (ref 3.5–5.3)
SODIUM: 139 meq/L (ref 135–145)

## 2014-09-17 LAB — T-HELPER CELL (CD4) - (RCID CLINIC ONLY)
CD4 % Helper T Cell: 30 % — ABNORMAL LOW (ref 33–55)
CD4 T Cell Abs: 490 /uL (ref 400–2700)

## 2014-09-20 LAB — HIV-1 RNA QUANT-NO REFLEX-BLD

## 2014-10-06 ENCOUNTER — Ambulatory Visit: Payer: Self-pay

## 2014-10-07 ENCOUNTER — Other Ambulatory Visit: Payer: Self-pay | Admitting: *Deleted

## 2014-10-07 DIAGNOSIS — B20 Human immunodeficiency virus [HIV] disease: Secondary | ICD-10-CM

## 2014-10-07 MED ORDER — EMTRICITAB-RILPIVIR-TENOFOV DF 200-25-300 MG PO TABS: 1.0000 | ORAL_TABLET | Freq: Every day | ORAL | Status: DC

## 2014-11-09 ENCOUNTER — Ambulatory Visit: Payer: Self-pay | Admitting: Internal Medicine

## 2014-11-12 ENCOUNTER — Other Ambulatory Visit: Payer: Self-pay | Admitting: Internal Medicine

## 2014-11-15 ENCOUNTER — Other Ambulatory Visit: Payer: Self-pay | Admitting: *Deleted

## 2014-11-15 DIAGNOSIS — A6000 Herpesviral infection of urogenital system, unspecified: Secondary | ICD-10-CM

## 2014-11-15 MED ORDER — VALACYCLOVIR HCL 1 G PO TABS: 1000.0000 mg | ORAL_TABLET | Freq: Every day | ORAL | Status: DC

## 2014-12-09 ENCOUNTER — Other Ambulatory Visit: Payer: Self-pay | Admitting: Internal Medicine

## 2014-12-21 ENCOUNTER — Encounter: Payer: Self-pay | Admitting: Internal Medicine

## 2014-12-21 ENCOUNTER — Ambulatory Visit (INDEPENDENT_AMBULATORY_CARE_PROVIDER_SITE_OTHER): Payer: Self-pay | Admitting: Internal Medicine

## 2014-12-21 ENCOUNTER — Telehealth: Payer: Self-pay | Admitting: *Deleted

## 2014-12-21 VITALS — BP 164/114 | HR 71 | Temp 97.2°F | Wt 156.0 lb

## 2014-12-21 DIAGNOSIS — A6000 Herpesviral infection of urogenital system, unspecified: Secondary | ICD-10-CM

## 2014-12-21 DIAGNOSIS — R87610 Atypical squamous cells of undetermined significance on cytologic smear of cervix (ASC-US): Secondary | ICD-10-CM

## 2014-12-21 DIAGNOSIS — B20 Human immunodeficiency virus [HIV] disease: Secondary | ICD-10-CM

## 2014-12-21 DIAGNOSIS — R030 Elevated blood-pressure reading, without diagnosis of hypertension: Secondary | ICD-10-CM

## 2014-12-21 MED ORDER — VALACYCLOVIR HCL 1 G PO TABS
1000.0000 mg | ORAL_TABLET | Freq: Every day | ORAL | Status: DC
Start: 2014-12-21 — End: 2016-01-18

## 2014-12-21 NOTE — Progress Notes (Signed)
Patient ID: Tiffany Morrison, female   DOB: May 10, 1986, 29 y.o.   MRN: [id number]       Patient ID: Tiffany Morrison, female   DOB: 1986/05/21, 29 y.o.   MRN: [id number]  HPI Tiffany Morrison is a 29yo F with HIV disease, CD 4 count of 490/VL<20 on complera. Continues to do well with adherence. She is doing well with medications and overall health. She is focused on weight gain. She exercises but usually only snacks and has 1 full meal per day (which she takes dinner-complera). Admits to having some unprotected sex with her female partner. No recent illnesses since last seen in clinic.  Outpatient Encounter Prescriptions as of 12/21/2014  Medication Sig  . aspirin-acetaminophen-caffeine (EXCEDRIN MIGRAINE) 250-250-65 MG per tablet Take 1 tablet by mouth every 6 (six) hours as needed for headache.  . clindamycin (CLEOCIN-T) 1 % lotion Apply topically 2 (two) times daily.  . valACYclovir (VALTREX) 1000 MG tablet Take 1 tablet (1,000 mg total) by mouth daily.  . [DISCONTINUED] COMPLERA 200-25-300 MG TABS TAKE 1 TABLET BY MOUTH EVERY DAY  . [DISCONTINUED] Emtricitab-Rilpivir-Tenofovir (COMPLERA) 200-25-300 MG TABS Take 1 tablet by mouth daily.  . [DISCONTINUED] valACYclovir (VALTREX) 1000 MG tablet Take 1 tablet (1,000 mg total) by mouth daily.  . [DISCONTINUED] amitriptyline (ELAVIL) 50 MG tablet Take 1 tablet (50 mg total) by mouth at bedtime. (Patient not taking: Reported on 12/21/2014)  . [DISCONTINUED] ondansetron (ZOFRAN) 4 MG tablet Take 1 tablet (4 mg total) by mouth 4 (four) times daily as needed for nausea or vomiting. (Patient not taking: Reported on 12/21/2014)     Patient Active Problem List   Diagnosis Date Noted  . Migraine headache 10/13/2013  . 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 Viral warts 12/25/2011  . Abnormal Pap smear of cervix 10/09/2011  . HIV infection 10/09/2011  . Genital herpes 09/14/2011     Health Maintenance Due  Topic Date Due  .  TETANUS/TDAP  03/11/2003     Review of Systems Review of Systems  Constitutional: Negative for fever, chills, diaphoresis, activity change, appetite change, fatigue and unexpected weight change.  HENT: Negative for congestion, sore throat, rhinorrhea, sneezing, trouble swallowing and sinus pressure.  Eyes: Negative for photophobia and visual disturbance.  Respiratory: Negative for cough, chest tightness, shortness of breath, wheezing and stridor.  Cardiovascular: Negative for chest pain, palpitations and leg swelling.  Gastrointestinal: Negative for nausea, vomiting, abdominal pain, diarrhea, constipation, blood in stool, abdominal distention and anal bleeding.  Genitourinary: Negative for dysuria, hematuria, flank pain and difficulty urinating.  Musculoskeletal: Negative for myalgias, back pain, joint swelling, arthralgias and gait problem.  Skin: Negative for color change, pallor, rash and wound.  Neurological: Negative for dizziness, tremors, weakness and light-headedness.  Hematological: Negative for adenopathy. Does not bruise/bleed easily.  Psychiatric/Behavioral: Negative for behavioral problems, confusion, sleep disturbance, dysphoric mood, decreased concentration and agitation.    Physical Exam   BP 164/114 mmHg  Pulse 71  Temp(Src) 97.2 F (36.2 C) (Oral)  Wt 156 lb (70.761 kg)  LMP 12/07/2014 Physical Exam  Constitutional:  oriented to person, place, and time. appears well-developed and well-nourished. No distress.  HENT:  Mouth/Throat: Oropharynx is clear and moist. No oropharyngeal exudate.  Cardiovascular: Normal rate, regular rhythm and normal heart sounds. Exam reveals no gallop and no friction rub.  No murmur heard.  Pulmonary/Chest: Effort normal and breath sounds normal. No respiratory distress.  has no wheezes.  Abdominal: Soft. Bowel sounds are normal.  exhibits no distension. There is no tenderness.  Lymphadenopathy: no cervical adenopathy.  Neurological:  alert and oriented to person, place, and time.  Skin: Skin is warm and dry. No rash noted. No erythema.  Psychiatric: a normal mood and affect. behavior is normal.   Lab Results  Component Value Date   CD4TCELL 30* 09/17/2014   Lab Results  Component Value Date   CD4TABS 490 09/17/2014   CD4TABS 810 03/29/2014   CD4TABS 540 12/22/2013   Lab Results  Component Value Date   HIV1RNAQUANT <20 09/17/2014   Lab Results  Component Value Date   HEPBSAB POS* 10/04/2011   No results found for: RPR  CBC Lab Results  Component Value Date   WBC 5.5 09/17/2014   RBC 3.86* 09/17/2014   HGB 12.3 09/17/2014   HCT 36.4 09/17/2014   PLT 302 09/17/2014   MCV 94.3 09/17/2014   MCH 31.9 09/17/2014   MCHC 33.8 09/17/2014   RDW 12.4 09/17/2014   LYMPHSABS 1.7 09/17/2014   MONOABS 0.4 09/17/2014   EOSABS 0.5 09/17/2014   BASOSABS 0.1 09/17/2014   BMET Lab Results  Component Value Date   NA 139 09/17/2014   K 3.4* 09/17/2014   CL 100 09/17/2014   CO2 28 09/17/2014   GLUCOSE 79 09/17/2014   BUN 8 09/17/2014   CREATININE 0.96 09/17/2014   CALCIUM 9.4 09/17/2014   GFRNONAA >89 12/22/2013   GFRAA >89 12/22/2013     Assessment and Plan  hiv disease = will change to odefsey ( complera with TAF) to minimize pill size. She has had troubles in the past with larger pill. This new medication should be easier for her to take regularly  hiv prevention = she has had occasional unprotected sex with her long term partner of 9 years. They are in a committed relationship. i have mentioned to her that it is difficult for men to acquire hiv from women, especially under good virologic control. If they are concerned about hiv transmission, may consider placing her female partner on PrEP  Abn pap exam,  hx of LGSIL = will make sure she has follow up over the summer  Hypertension = likely isolated elevated BP. We will check at next visit to see if warrants the need for anti-hypertensive meds  rtc in 3  months, labs 2 wk before visit

## 2014-12-21 NOTE — Telephone Encounter (Signed)
Per Dr. Drue SecondSnider, [id number] [address]. Patient will contact pharmacy to arrange pickup/mailing.  Odefsey now covered by [employer]. Andree CossHowell, Kevina Piloto M, RN

## 2015-03-13 ENCOUNTER — Other Ambulatory Visit: Payer: Self-pay | Admitting: Internal Medicine

## 2015-03-15 ENCOUNTER — Other Ambulatory Visit: Payer: Self-pay

## 2015-03-15 DIAGNOSIS — B20 Human immunodeficiency virus [HIV] disease: Secondary | ICD-10-CM

## 2015-03-15 LAB — CBC WITH DIFFERENTIAL/PLATELET
BASOS ABS: 0.1 10*3/uL (ref 0.0–0.1)
Basophils Relative: 1 % (ref 0–1)
Eosinophils Absolute: 0.3 10*3/uL (ref 0.0–0.7)
Eosinophils Relative: 5 % (ref 0–5)
HCT: 37.9 % (ref 36.0–46.0)
Hemoglobin: 12.7 g/dL (ref 12.0–15.0)
LYMPHS PCT: 30 % (ref 12–46)
Lymphs Abs: 1.8 10*3/uL (ref 0.7–4.0)
MCH: 32 pg (ref 26.0–34.0)
MCHC: 33.5 g/dL (ref 30.0–36.0)
MCV: 95.5 fL (ref 78.0–100.0)
MPV: 10.5 fL (ref 8.6–12.4)
Monocytes Absolute: 0.4 10*3/uL (ref 0.1–1.0)
Monocytes Relative: 6 % (ref 3–12)
Neutro Abs: 3.5 10*3/uL (ref 1.7–7.7)
Neutrophils Relative %: 58 % (ref 43–77)
PLATELETS: 252 10*3/uL (ref 150–400)
RBC: 3.97 MIL/uL (ref 3.87–5.11)
RDW: 14.5 % (ref 11.5–15.5)
WBC: 6 10*3/uL (ref 4.0–10.5)

## 2015-03-15 LAB — COMPREHENSIVE METABOLIC PANEL
ALT: 9 U/L (ref 0–35)
AST: 16 U/L (ref 0–37)
Albumin: 4.1 g/dL (ref 3.5–5.2)
Alkaline Phosphatase: 50 U/L (ref 39–117)
BILIRUBIN TOTAL: 0.5 mg/dL (ref 0.2–1.2)
BUN: 9 mg/dL (ref 6–23)
CO2: 28 meq/L (ref 19–32)
CREATININE: 0.88 mg/dL (ref 0.50–1.10)
Calcium: 9.5 mg/dL (ref 8.4–10.5)
Chloride: 103 mEq/L (ref 96–112)
Glucose, Bld: 77 mg/dL (ref 70–99)
Potassium: 3.9 mEq/L (ref 3.5–5.3)
Sodium: 138 mEq/L (ref 135–145)
Total Protein: 7.4 g/dL (ref 6.0–8.3)

## 2015-03-16 LAB — T-HELPER CELL (CD4) - (RCID CLINIC ONLY)
CD4 % Helper T Cell: 27 % — ABNORMAL LOW (ref 33–55)
CD4 T Cell Abs: 510 /uL (ref 400–2700)

## 2015-03-16 LAB — HIV-1 RNA QUANT-NO REFLEX-BLD: HIV-1 RNA Quant, Log: <1.3 {Log} (ref <1.30)

## 2015-03-29 ENCOUNTER — Ambulatory Visit (INDEPENDENT_AMBULATORY_CARE_PROVIDER_SITE_OTHER): Payer: Self-pay | Admitting: Internal Medicine

## 2015-03-29 ENCOUNTER — Encounter: Payer: Self-pay | Admitting: Internal Medicine

## 2015-03-29 VITALS — BP 121/82 | HR 53 | Temp 98.2°F | Ht 67.0 in | Wt 155.0 lb

## 2015-03-29 DIAGNOSIS — R87610 Atypical squamous cells of undetermined significance on cytologic smear of cervix (ASC-US): Secondary | ICD-10-CM

## 2015-03-29 DIAGNOSIS — Z0000 Encounter for general adult medical examination without abnormal findings: Secondary | ICD-10-CM

## 2015-03-29 DIAGNOSIS — B20 Human immunodeficiency virus [HIV] disease: Secondary | ICD-10-CM

## 2015-03-29 NOTE — Progress Notes (Signed)
Patient ID: Tiffany Morrison, female   DOB: November 03, 1985, 29 y.o.   MRN: [id number]       Patient ID: Tiffany Morrison, female   DOB: 11/29/1985, 29 y.o.   MRN: [id number]  HPI  29yo F with HIV disease, CD 4 count 510/VL<20, has been on odefsey for the past 3 months without any difficulties. No side effect. She is doing well overall. Not presently working. Still with partner of 9 years.  She had a uri in march that lasted roughly 3 weeks but has now been in good health. She has upcoming pap smear next month (overdue).  She lives in [place]  Soc hx: still smoking  Outpatient Encounter Prescriptions as of 03/29/2015  Medication Sig  . clindamycin (CLEOCIN-T) 1 % lotion Apply topically 2 (two) times daily.  . naproxen (NAPROSYN) 250 MG tablet Take by mouth 2 (two) times daily with a meal.  . ODEFSEY 200-25-25 MG TABS per tablet TK 1 T PO QD  . valACYclovir (VALTREX) 1000 MG tablet Take 1 tablet (1,000 mg total) by mouth daily.  . [DISCONTINUED] aspirin-acetaminophen-caffeine (EXCEDRIN MIGRAINE) 250-250-65 MG per tablet Take 1 tablet by mouth every 6 (six) hours as needed for headache.  . [DISCONTINUED] valACYclovir (VALTREX) 1000 MG tablet TAKE 1 TABLET BY MOUTH DAILY   No facility-administered encounter medications on file as of 03/29/2015.     Patient Active Problem List   Diagnosis Date Noted  . Migraine headache 10/13/2013  . 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 Viral warts 12/25/2011  . Abnormal Pap smear of cervix 10/09/2011  . HIV infection 10/09/2011  . Genital herpes 09/14/2011     Health Maintenance Due  Topic Date Due  . TETANUS/TDAP  03/11/2003     Review of Systems 10 review of systems are negative Physical Exam   BP 121/82 mmHg  Pulse 53  Temp(Src) 98.2 F (36.8 C) (Oral)  Ht  (1.702 m)  Wt 155 lb (70.308 kg)  BMI 24.27 kg/m2  LMP 03/29/2015 Physical Exam  Constitutional:  oriented to person, place, and time.  appears well-developed and well-nourished. No distress.  HENT: [place]/AT, PERRLA, no scleral icterus Mouth/Throat: Oropharynx is clear and moist. No oropharyngeal exudate.  Skin: Skin is warm and dry. No rash noted. No erythema.  Psychiatric: a normal mood and affect.  behavior is normal.   Lab Results  Component Value Date   CD4TCELL 27* 03/15/2015   Lab Results  Component Value Date   CD4TABS 510 03/15/2015   CD4TABS 490 09/17/2014   CD4TABS 810 03/29/2014   Lab Results  Component Value Date   HIV1RNAQUANT <20 03/15/2015   Lab Results  Component Value Date   HEPBSAB POS* 10/04/2011   No results found for: RPR  CBC Lab Results  Component Value Date   WBC 6.0 03/15/2015   RBC 3.97 03/15/2015   HGB 12.7 03/15/2015   HCT 37.9 03/15/2015   PLT 252 03/15/2015   MCV 95.5 03/15/2015   MCH 32.0 03/15/2015   MCHC 33.5 03/15/2015   RDW 14.5 03/15/2015   LYMPHSABS 1.8 03/15/2015   MONOABS 0.4 03/15/2015   EOSABS 0.3 03/15/2015   BASOSABS 0.1 03/15/2015   BMET Lab Results  Component Value Date   NA 138 03/15/2015   K 3.9 03/15/2015   CL 103 03/15/2015   CO2 28 03/15/2015   GLUCOSE 77 03/15/2015   BUN 9 03/15/2015   CREATININE 0.88 03/15/2015   CALCIUM 9.5 03/15/2015   GFRNONAA >  89 12/22/2013   GFRAA >89 12/22/2013     Assessment and Plan  hiv disease = well controlled on odefsey. Continue with current regimen  Hx of LGSIL.ascus = pap next month  Hx of herpes = continue with acyclovir  Health maintenance = flu vaccine in sept/oct

## 2015-04-06 ENCOUNTER — Other Ambulatory Visit: Payer: Self-pay | Admitting: *Deleted

## 2015-04-06 DIAGNOSIS — B20 Human immunodeficiency virus [HIV] disease: Secondary | ICD-10-CM

## 2015-04-06 MED ORDER — ODEFSEY 200-25-25 MG PO TABS: 1.0000 | ORAL_TABLET | Freq: Every day | ORAL | Status: DC

## 2015-04-06 NOTE — Telephone Encounter (Signed)
ADAP Application 

## 2015-04-15 ENCOUNTER — Ambulatory Visit: Payer: Self-pay

## 2015-04-22 ENCOUNTER — Ambulatory Visit (INDEPENDENT_AMBULATORY_CARE_PROVIDER_SITE_OTHER): Payer: Self-pay | Admitting: *Deleted

## 2015-04-22 DIAGNOSIS — Z113 Encounter for screening for infections with a predominantly sexual mode of transmission: Secondary | ICD-10-CM

## 2015-04-22 DIAGNOSIS — Z124 Encounter for screening for malignant neoplasm of cervix: Secondary | ICD-10-CM

## 2015-04-22 NOTE — Patient Instructions (Signed)
Your results will be ready in about a week.  I will mail them to you.  Thank you for coming to the Center for your care.  Aydin Hink,  RN 

## 2015-04-22 NOTE — Progress Notes (Signed)
  Subjective:     Kimbely Whiteaker is a 29 y.o. woman who comes in today for a  pap smear only.  Previous abnormal Pap smears: yes, HPV. Contraception: condoms.  Right-sided labial majora swelling starting before menses and gradually disappearing until next menses.  Swelling is slightly "itchy and painful."  Does not effect intercourse.   Objective:  Creamy, whitish vaginal discharge.  No itching.  1.5 CM pink, non-inflamed, non-edematous area noted on perineal floor.  Not painful at this time.  LMP 03/29/2015 Pelvic Exam:  Pap smear obtained.   Assessment:    Screening pap smear.   Plan:    Follow up in one year, or as indicated by Pap results.  Pt given educational materials re: HIV and women, self-esteem, BSE, nutrition and diet management, PAP smears and partner safety. Pt given condoms.

## 2015-04-25 LAB — CERVICOVAGINAL ANCILLARY ONLY
Bacterial vaginitis: POSITIVE — AB
Candida vaginitis: NEGATIVE
Chlamydia: NEGATIVE
Neisseria Gonorrhea: NEGATIVE
Trichomonas: POSITIVE — AB

## 2015-04-25 LAB — CYTOLOGY - PAP

## 2015-04-28 ENCOUNTER — Telehealth: Payer: Self-pay | Admitting: *Deleted

## 2015-04-28 DIAGNOSIS — N760 Acute vaginitis: Secondary | ICD-10-CM

## 2015-04-28 DIAGNOSIS — B9689 Other specified bacterial agents as the cause of diseases classified elsewhere: Secondary | ICD-10-CM

## 2015-04-28 DIAGNOSIS — A599 Trichomoniasis, unspecified: Secondary | ICD-10-CM

## 2015-04-28 MED ORDER — METRONIDAZOLE 500 MG PO TABS: 500.0000 mg | ORAL_TABLET | Freq: Two times a day (BID) | ORAL | Status: DC

## 2015-04-28 NOTE — Telephone Encounter (Signed)
Per Dr. Drue Second, patient needs treatment of metronidazole  twice daily for 7 days.  RN left message for patient notifying her prescription will be delivered to her by [facility] and advising her to have her partners tested/treated as this can be passed back and forth.  Patient's recent PAP showed bacterial vaginosis and trichomonas.   Andree Coss, RN

## 2015-05-02 ENCOUNTER — Encounter: Payer: Self-pay | Admitting: *Deleted

## 2015-05-06 ENCOUNTER — Telehealth: Payer: Self-pay | Admitting: *Deleted

## 2015-05-06 NOTE — Telephone Encounter (Signed)
Patient advised she is taking the Flagyl now. No problems and wanted to know if her boyfriend should be treated as well. Advised her yes.

## 2015-05-06 NOTE — Telephone Encounter (Signed)
-----   Message from Judyann Munson, MD sent at 05/04/2015  8:36 PM EDT ----- Can u call her to make sure she took flagyl

## 2015-06-08 NOTE — Progress Notes (Signed)
Notified [facility] by fax. Denessa Cavan M, RN 

## 2015-07-04 ENCOUNTER — Other Ambulatory Visit: Payer: Self-pay | Admitting: *Deleted

## 2015-07-04 ENCOUNTER — Other Ambulatory Visit (INDEPENDENT_AMBULATORY_CARE_PROVIDER_SITE_OTHER): Payer: Self-pay

## 2015-07-04 DIAGNOSIS — Z113 Encounter for screening for infections with a predominantly sexual mode of transmission: Secondary | ICD-10-CM

## 2015-07-04 DIAGNOSIS — B20 Human immunodeficiency virus [HIV] disease: Secondary | ICD-10-CM

## 2015-07-04 LAB — COMPLETE METABOLIC PANEL WITH GFR
ALT: 8 U/L (ref 6–29)
AST: 15 U/L (ref 10–30)
Albumin: 3.9 g/dL (ref 3.6–5.1)
Alkaline Phosphatase: 45 U/L (ref 33–115)
BUN: 8 mg/dL (ref 7–25)
CHLORIDE: 104 mmol/L (ref 98–110)
CO2: 28 mmol/L (ref 20–31)
Calcium: 9.3 mg/dL (ref 8.6–10.2)
Creat: 0.84 mg/dL (ref 0.50–1.10)
GLUCOSE: 53 mg/dL — AB (ref 65–99)
Potassium: 3.9 mmol/L (ref 3.5–5.3)
SODIUM: 140 mmol/L (ref 135–146)
TOTAL PROTEIN: 7 g/dL (ref 6.1–8.1)
Total Bilirubin: 0.3 mg/dL (ref 0.2–1.2)

## 2015-07-04 LAB — CBC WITH DIFFERENTIAL/PLATELET
BASOS ABS: 0 10*3/uL (ref 0.0–0.1)
BASOS PCT: 1 % (ref 0–1)
EOS ABS: 0.5 10*3/uL (ref 0.0–0.7)
EOS PCT: 11 % — AB (ref 0–5)
HCT: 34.4 % — ABNORMAL LOW (ref 36.0–46.0)
Hemoglobin: 11.7 g/dL — ABNORMAL LOW (ref 12.0–15.0)
LYMPHS ABS: 1.6 10*3/uL (ref 0.7–4.0)
Lymphocytes Relative: 34 % (ref 12–46)
MCH: 32.2 pg (ref 26.0–34.0)
MCHC: 34 g/dL (ref 30.0–36.0)
MCV: 94.8 fL (ref 78.0–100.0)
MONOS PCT: 9 % (ref 3–12)
MPV: 10.2 fL (ref 8.6–12.4)
Monocytes Absolute: 0.4 10*3/uL (ref 0.1–1.0)
NEUTROS PCT: 45 % (ref 43–77)
Neutro Abs: 2.2 10*3/uL (ref 1.7–7.7)
PLATELETS: 231 10*3/uL (ref 150–400)
RBC: 3.63 MIL/uL — ABNORMAL LOW (ref 3.87–5.11)
RDW: 13.5 % (ref 11.5–15.5)
WBC: 4.8 10*3/uL (ref 4.0–10.5)

## 2015-07-04 LAB — RPR

## 2015-07-05 LAB — T-HELPER CELL (CD4) - (RCID CLINIC ONLY)
CD4 T CELL HELPER: 29 % — AB (ref 33–55)
CD4 T Cell Abs: 480 /uL (ref 400–2700)

## 2015-07-05 LAB — HIV-1 RNA QUANT-NO REFLEX-BLD: HIV-1 RNA Quant, Log: <1.3 {Log} (ref <1.30)

## 2015-07-19 ENCOUNTER — Ambulatory Visit (INDEPENDENT_AMBULATORY_CARE_PROVIDER_SITE_OTHER): Payer: Self-pay | Admitting: Internal Medicine

## 2015-07-19 ENCOUNTER — Encounter: Payer: Self-pay | Admitting: Internal Medicine

## 2015-07-19 VITALS — BP 180/103 | HR 59 | Temp 98.1°F | Wt 159.2 lb

## 2015-07-19 DIAGNOSIS — R61 Generalized hyperhidrosis: Secondary | ICD-10-CM

## 2015-07-19 DIAGNOSIS — B20 Human immunodeficiency virus [HIV] disease: Secondary | ICD-10-CM

## 2015-07-19 DIAGNOSIS — A499 Bacterial infection, unspecified: Secondary | ICD-10-CM

## 2015-07-19 DIAGNOSIS — B9689 Other specified bacterial agents as the cause of diseases classified elsewhere: Secondary | ICD-10-CM

## 2015-07-19 DIAGNOSIS — N760 Acute vaginitis: Secondary | ICD-10-CM

## 2015-07-19 DIAGNOSIS — R030 Elevated blood-pressure reading, without diagnosis of hypertension: Secondary | ICD-10-CM

## 2015-07-19 DIAGNOSIS — Z23 Encounter for immunization: Secondary | ICD-10-CM

## 2015-07-19 DIAGNOSIS — R87610 Atypical squamous cells of undetermined significance on cytologic smear of cervix (ASC-US): Secondary | ICD-10-CM

## 2015-07-19 NOTE — Progress Notes (Signed)
Patient ID: Tiffany Morrison, female   DOB: 19-Jun-1986, 29 y.o.   MRN: [id number]       Patient ID: Tiffany Morrison, female   DOB: April 24, 1986, 29 y.o.   MRN: [id number]  HPI  29yo F with HIV disease, CD 4 count of 480/VL<20 on odefsey. Doing well with adherence. Also on HSV proph. In July, PAP still showed evidence of ASCUS, trich and BV. She was treated with metronidazole. Having increasing stressors at home with partners children now living with them. ' Outpatient Encounter Prescriptions as of 07/19/2015  Medication Sig  . clindamycin (CLEOCIN-T) 1 % lotion Apply topically 2 (two) times daily.  . metroNIDAZOLE (FLAGYL) 500 MG tablet Take 1 tablet (500 mg total) by mouth 2 (two) times daily.  . naproxen (NAPROSYN) 250 MG tablet Take by mouth 2 (two) times daily with a meal.  . ODEFSEY 200-25-25 MG TABS per tablet Take 1 tablet by mouth daily with breakfast.  . valACYclovir (VALTREX) 1000 MG tablet Take 1 tablet (1,000 mg total) by mouth daily.   No facility-administered encounter medications on file as of 07/19/2015.     Patient Active Problem List   Diagnosis Date Noted  . Migraine headache 10/13/2013  . 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 Viral warts 12/25/2011  . Abnormal Pap smear of cervix 10/09/2011  . HIV infection 10/09/2011  . Genital herpes 09/14/2011     Health Maintenance Due  Topic Date Due  . TETANUS/TDAP  03/11/2003  . INFLUENZA VACCINE  05/02/2015     Review of Systems + stress, 10 point ros is negative Physical Exam   BP 180/103 mmHg  Pulse 59  Temp(Src) 98.1 F (36.7 C) (Oral)  Wt 159 lb 4 oz (72.235 kg)  LMP 07/15/2015 (Exact Date) Physical Exam  Constitutional:  oriented to person, place, and time. appears well-developed and well-nourished. No distress.  HENT: [place]/AT, PERRLA, no scleral icterus Mouth/Throat: Oropharynx is clear and moist. No oropharyngeal exudate.  Cardiovascular: Normal rate, regular rhythm  and normal heart sounds. Exam reveals no gallop and no friction rub.  No murmur heard.  Pulmonary/Chest: Effort normal and breath sounds normal. No respiratory distress.  has no wheezes.  Neck = supple, no nuchal rigidity Abdominal: Soft. Bowel sounds are normal.  exhibits no distension. There is no tenderness.  Lymphadenopathy: no cervical adenopathy. No axillary adenopathy Neurological: alert and oriented to person, place, and time.  Skin: Skin is warm and dry. No rash noted. No erythema.  Psychiatric: a normal mood and affect.  behavior is normal.   Lab Results  Component Value Date   CD4TCELL 29* 07/04/2015   Lab Results  Component Value Date   CD4TABS 480 07/04/2015   CD4TABS 510 03/15/2015   CD4TABS 490 09/17/2014   Lab Results  Component Value Date   HIV1RNAQUANT <20 07/04/2015   Lab Results  Component Value Date   HEPBSAB POS* 10/04/2011   No results found for: RPR  CBC Lab Results  Component Value Date   WBC 4.8 07/04/2015   RBC 3.63* 07/04/2015   HGB 11.7* 07/04/2015   HCT 34.4* 07/04/2015   PLT 231 07/04/2015   MCV 94.8 07/04/2015   MCH 32.2 07/04/2015   MCHC 34.0 07/04/2015   RDW 13.5 07/04/2015   LYMPHSABS 1.6 07/04/2015   MONOABS 0.4 07/04/2015   EOSABS 0.5 07/04/2015   BASOSABS 0.0 07/04/2015   BMET Lab Results  Component Value Date   NA 140 07/04/2015   K 3.9  07/04/2015   CL 104 07/04/2015   CO2 28 07/04/2015   GLUCOSE 53* 07/04/2015   BUN 8 07/04/2015   CREATININE 0.84 07/04/2015   CALCIUM 9.3 07/04/2015   GFRNONAA >89 07/04/2015   GFRAA >89 07/04/2015     Assessment and Plan   hiv disease = doing well with meds. Will continue on odefsey  Smoking cessation = congratulated her for 6 wk of cessation  Hx of LGSIL = her recent pap in July showed ASCUS. Will have her repeat PAP via Gyn to see if needs any further treatment  Hx of BV = treated, now asymptomatic  Pre-htn = unclear if stress related. Will have her check bp at home for  2 wk, and have her come back to reassess  nightsweats = thought to be viral. Will have her do temp diary x 2 wk  Health maintenance = flu vac  rtc in 2-3 wk

## 2015-08-16 ENCOUNTER — Ambulatory Visit: Payer: Self-pay | Admitting: Internal Medicine

## 2015-08-19 IMAGING — CR DG SHOULDER 2+V*R*
3 series · 3 of 3 positions shown · non-contrast
Comparison: None.

CLINICAL DATA: Right shoulder pain for 2 months. No trauma.

EXAM:
RIGHT SHOULDER - 2+ VIEW

[w shoulder ap internal righ]
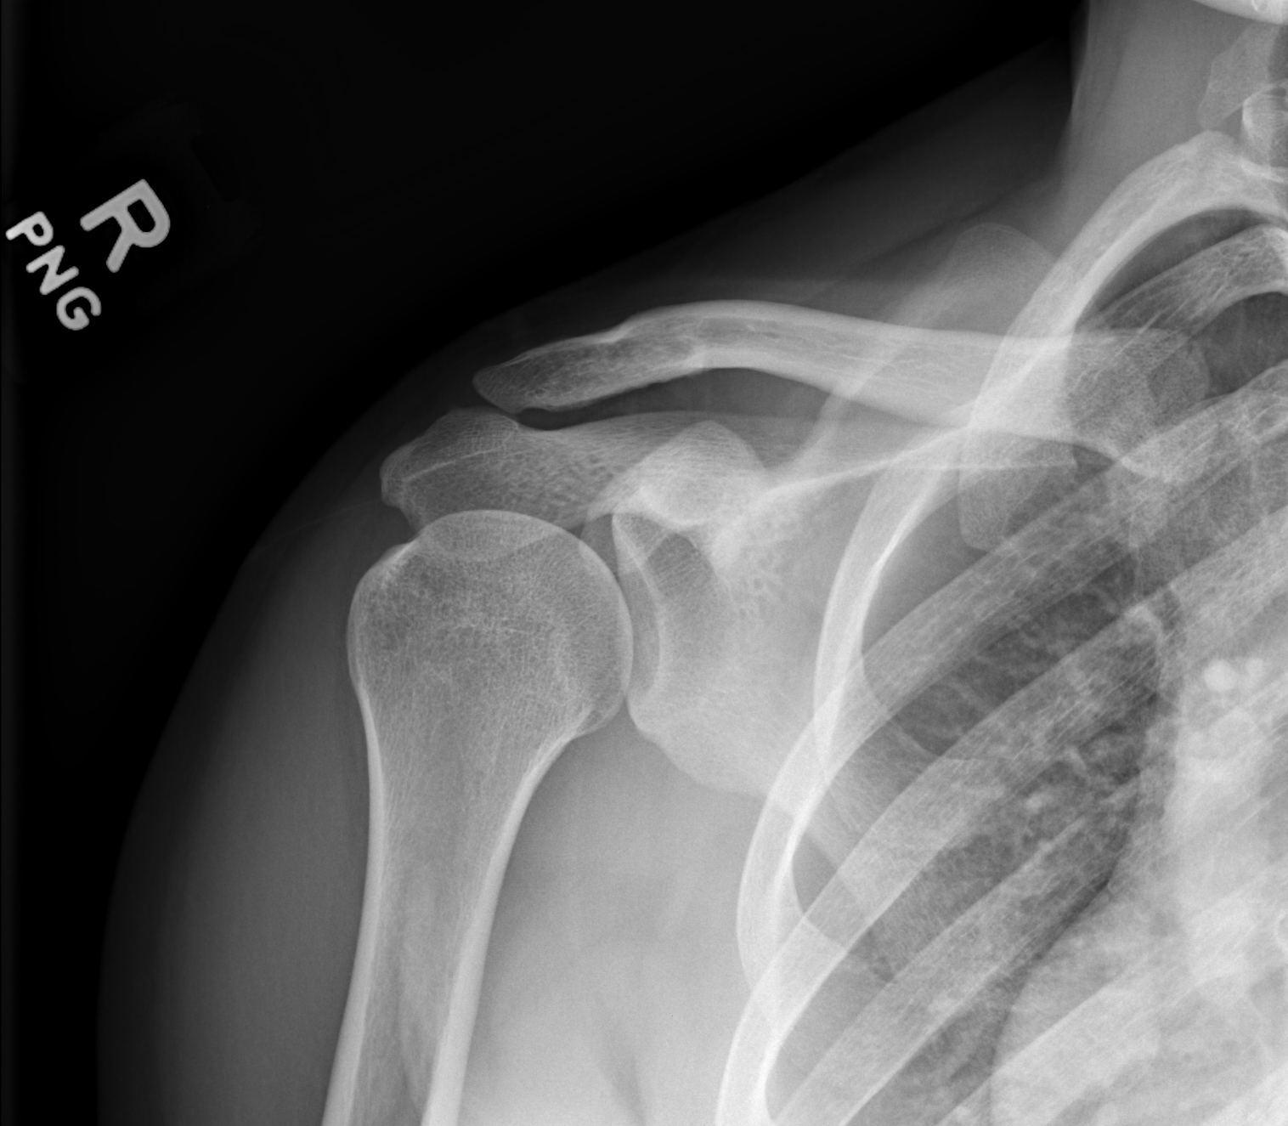

[w shoulder y view right]
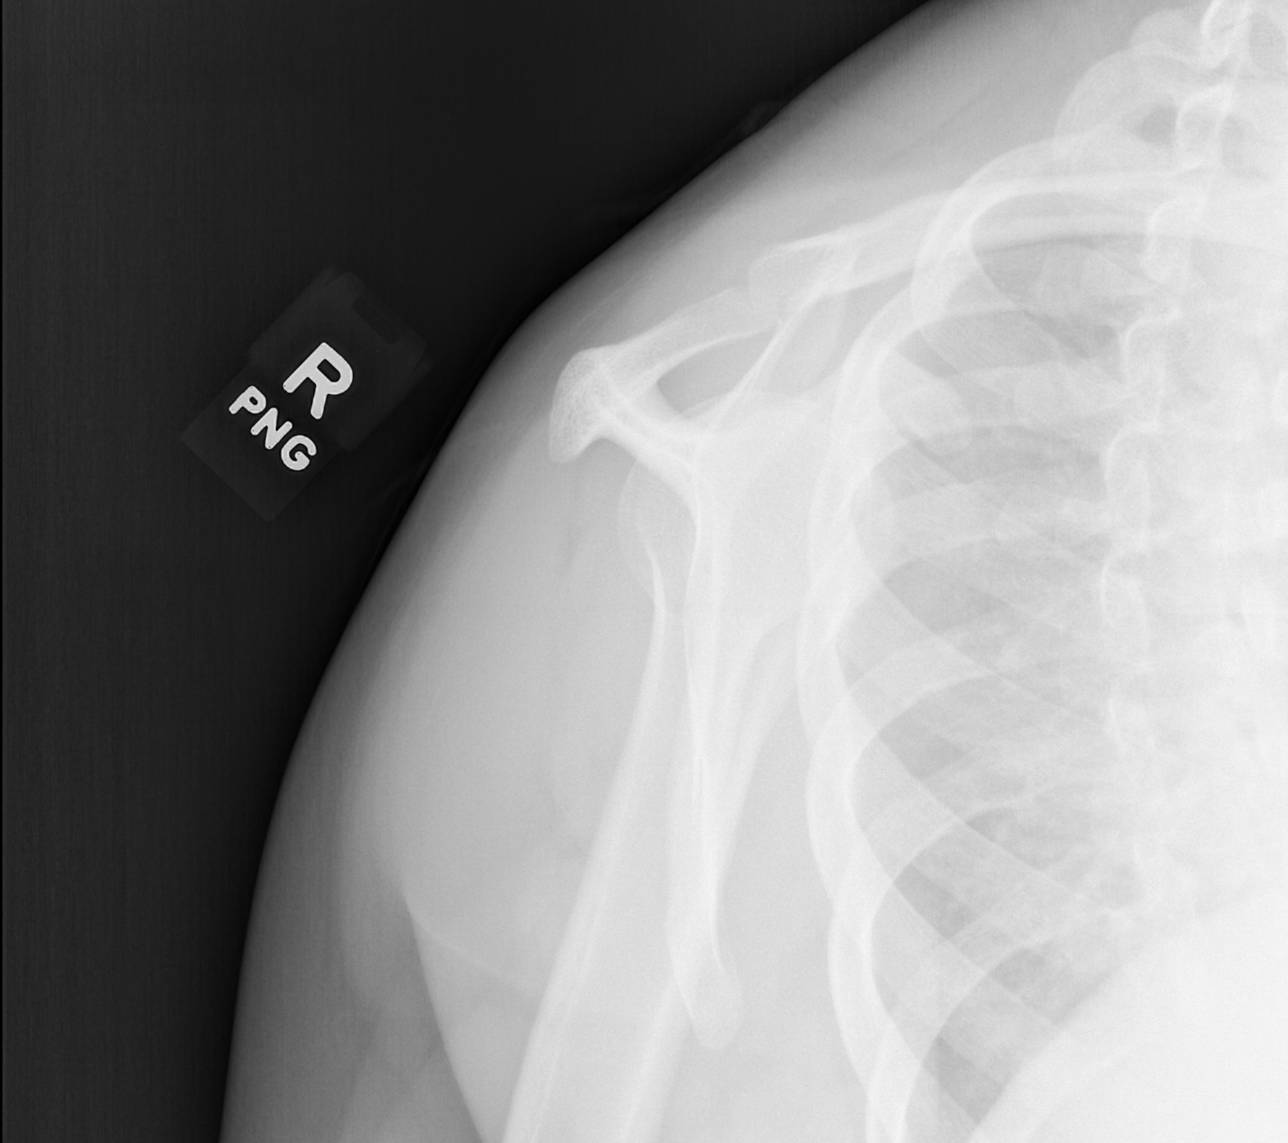

[x shoulder axillary right]
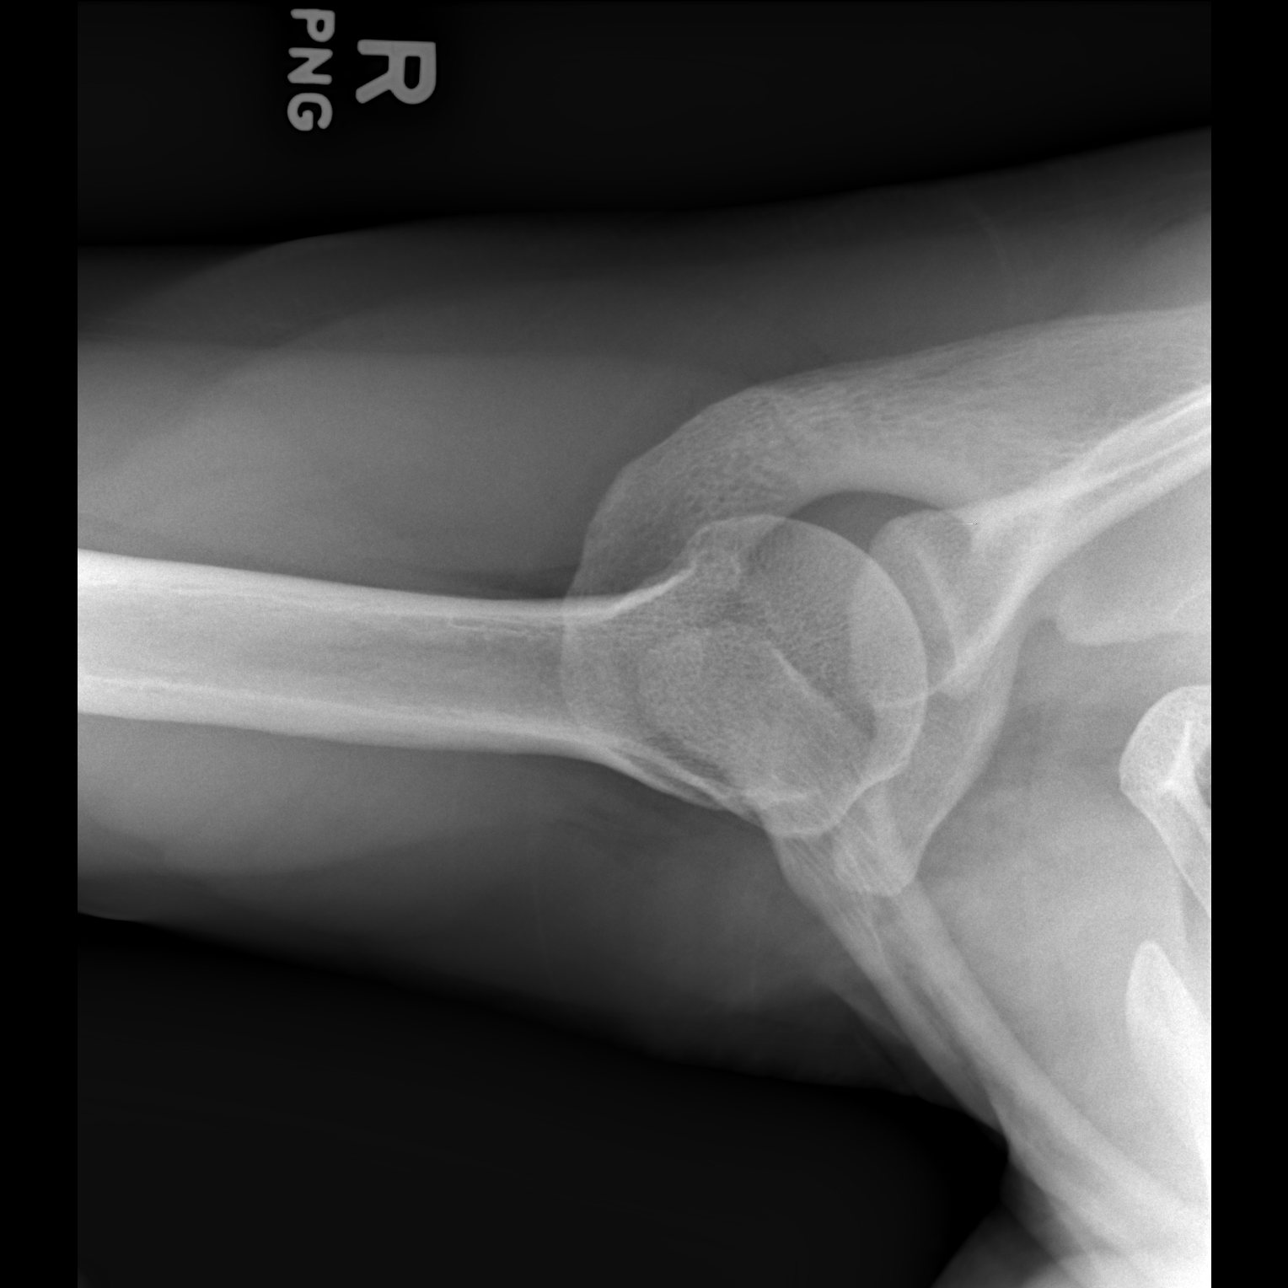

[3 of 3 positions shown; findings below may reference images not displayed]

FINDINGS: Distal aspect of the clavicle appears minimally elevated. This may
be related to projection or prior separation.

No fracture or dislocation.

No significant degenerative changes.

Visualized lungs clear.
IMPRESSION: Distal aspect of the clavicle appears minimally elevated. This may
be related to projection or prior separation.

## 2015-09-05 ENCOUNTER — Other Ambulatory Visit: Payer: Self-pay

## 2015-09-13 ENCOUNTER — Ambulatory Visit (INDEPENDENT_AMBULATORY_CARE_PROVIDER_SITE_OTHER): Payer: Self-pay | Admitting: Internal Medicine

## 2015-09-13 ENCOUNTER — Encounter: Payer: Self-pay | Admitting: Internal Medicine

## 2015-09-13 VITALS — BP 157/98 | HR 75 | Temp 97.8°F | Ht 69.0 in | Wt 161.8 lb

## 2015-09-13 DIAGNOSIS — L709 Acne, unspecified: Secondary | ICD-10-CM

## 2015-09-13 DIAGNOSIS — B20 Human immunodeficiency virus [HIV] disease: Secondary | ICD-10-CM

## 2015-09-13 DIAGNOSIS — R030 Elevated blood-pressure reading, without diagnosis of hypertension: Secondary | ICD-10-CM

## 2015-09-13 DIAGNOSIS — R87610 Atypical squamous cells of undetermined significance on cytologic smear of cervix (ASC-US): Secondary | ICD-10-CM

## 2015-09-13 NOTE — Progress Notes (Signed)
Patient ID: Tiffany Morrison, female   DOB: November 23, 1985, 29 y.o.   MRN: [id number]       Patient ID: Tiffany Morrison, female   DOB: November 23, 1985, [age] y.o.   MRN: [id number]  HPI 29yo F with HIV disease, CD 4 count 480/VL <20, on odefsey. Doing well with adherence. She became engaged to her boyfriend of 5915yrs on thanksgiving. She has been taking bactrim daily for acne prescribed by her PCP. She previously was on a course of doxy then switched to bactrim for the past 2 wk. She states that when she is off of her abtx she has breakout of acne to her chin around her mouth/lower 1/2 of face. She does not use benzoyl peroxide or medicated facial cleaners often.  She otherwise is doing well.  She is concerned that her BP reading is elevated. She states that she had been going to [facility] to check her bp. She states that 4 out of 5 times it is in normal range  FHX: hypertension  Outpatient Encounter Prescriptions as of 09/13/2015  Medication Sig  . naproxen (NAPROSYN) 250 MG tablet Take by mouth 2 (two) times daily with a meal.  . ODEFSEY 200-25-25 MG TABS per tablet Take 1 tablet by mouth daily with breakfast.  . valACYclovir (VALTREX) 1000 MG tablet Take 1 tablet (1,000 mg total) by mouth daily.  . [DISCONTINUED] clindamycin (CLEOCIN-T) 1 % lotion Apply topically 2 (two) times daily.  . [DISCONTINUED] metroNIDAZOLE (FLAGYL) 500 MG tablet Take 1 tablet (500 mg total) by mouth 2 (two) times daily.   No facility-administered encounter medications on file as of 09/13/2015.     Patient Active Problem List   Diagnosis Date Noted  . Migraine headache 10/13/2013  . 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 Viral warts 12/25/2011  . Abnormal Pap smear of cervix 10/09/2011  . HIV infection 10/09/2011  . Genital herpes 09/14/2011     Health Maintenance Due  Topic Date Due  . TETANUS/TDAP  03/11/2003     Review of Systems  Constitutional: Negative for fever, chills,  diaphoresis, activity change, appetite change, fatigue and unexpected weight change.  HENT: Negative for congestion, sore throat, rhinorrhea, sneezing, trouble swallowing and sinus pressure.  Eyes: Negative for photophobia and visual disturbance.  Respiratory: Negative for cough, chest tightness, shortness of breath, wheezing and stridor.  Cardiovascular: Negative for chest pain, palpitations and leg swelling.  Gastrointestinal: Negative for nausea, vomiting, abdominal pain, diarrhea, constipation, blood in stool, abdominal distention and anal bleeding.  Genitourinary: Negative for dysuria, hematuria, flank pain and difficulty urinating.  Musculoskeletal: Negative for myalgias, back pain, joint swelling, arthralgias and gait problem.  Skin: + acne  Neurological: Negative for dizziness, tremors, weakness and light-headedness.  Hematological: Negative for adenopathy. Does not bruise/bleed easily.  Psychiatric/Behavioral: Negative for behavioral problems, confusion, sleep disturbance, dysphoric mood, decreased concentration and agitation.    Physical Exam   BP 157/98 mmHg  Pulse 75  Temp(Src) 97.8 F (36.6 C)  Ht 5\' 9"  (1.753 m)  Wt 161 lb 12.8 oz (73.392 kg)  BMI 23.88 kg/m2  LMP 09/06/2015 Physical Exam  Constitutional:  oriented to person, place, and time. appears well-developed and well-nourished. No distress.  HENT: [place]/AT, PERRLA, no scleral icterus Mouth/Throat: Oropharynx is clear and moist. No oropharyngeal exudate.  Cardiovascular: Normal rate, regular rhythm and normal heart sounds. Exam reveals no gallop and no friction rub.  No murmur heard.  Pulmonary/Chest: Effort normal and breath sounds normal. No respiratory distress.  has no wheezes.  Neck = supple, no nuchal rigidity Lymphadenopathy: no cervical adenopathy. No axillary adenopathy Neurological: alert and oriented to person, place, and time.  Skin: Skin is warm and dry. No rash noted. No erythema.  Psychiatric: a  normal mood and affect.  behavior is normal.   Lab Results  Component Value Date   CD4TCELL 29* 07/04/2015   Lab Results  Component Value Date   CD4TABS 480 07/04/2015   CD4TABS 510 03/15/2015   CD4TABS 490 09/17/2014   Lab Results  Component Value Date   HIV1RNAQUANT <20 07/04/2015   Lab Results  Component Value Date   HEPBSAB POS* 10/04/2011   No results found for: RPR  CBC Lab Results  Component Value Date   WBC 4.8 07/04/2015   RBC 3.63* 07/04/2015   HGB 11.7* 07/04/2015   HCT 34.4* 07/04/2015   PLT 231 07/04/2015   MCV 94.8 07/04/2015   MCH 32.2 07/04/2015   MCHC 34.0 07/04/2015   RDW 13.5 07/04/2015   LYMPHSABS 1.6 07/04/2015   MONOABS 0.4 07/04/2015   EOSABS 0.5 07/04/2015   BASOSABS 0.0 07/04/2015   BMET Lab Results  Component Value Date   NA 140 07/04/2015   K 3.9 07/04/2015   CL 104 07/04/2015   CO2 28 07/04/2015   GLUCOSE 53* 07/04/2015   BUN 8 07/04/2015   CREATININE 0.84 07/04/2015   CALCIUM 9.3 07/04/2015   GFRNONAA >89 07/04/2015   GFRAA >89 07/04/2015     Assessment and Plan  29yo F with well controlled hiv disease on odefsey, also takes valtrex for hsv proph. She had hx of LGSIL needs repeat pap next year. Has episode of facial acne currently on oral antibiotics. Last 2 visits, has high BP but when checked in the community, it is normal  hiv disease =well controlled, will check labs at next visit  LGSIL = will have her follow up with gyn at next visit  Facial acne= distribution suggestive of perioral folliculitis where oral metronidazole gel maybe more appropriate. Recommend she use benzoyl peroxide or salicytic acid facial wash instead of oral abtx. Use abtx as last reserve  Pre htn = again wonder if she suffers from white coat syndrome. Will have her bring in bp diary at next visit  rtc in 3 mon

## 2015-09-19 ENCOUNTER — Ambulatory Visit: Payer: Self-pay | Admitting: Internal Medicine

## 2015-11-07 ENCOUNTER — Ambulatory Visit: Payer: Self-pay

## 2015-11-28 ENCOUNTER — Other Ambulatory Visit (INDEPENDENT_AMBULATORY_CARE_PROVIDER_SITE_OTHER): Payer: Self-pay

## 2015-11-28 DIAGNOSIS — B20 Human immunodeficiency virus [HIV] disease: Secondary | ICD-10-CM

## 2015-11-28 LAB — CBC WITH DIFFERENTIAL/PLATELET
Basophils Absolute: 0 10*3/uL (ref 0.0–0.1)
Basophils Relative: 1 % (ref 0–1)
Eosinophils Absolute: 0.3 10*3/uL (ref 0.0–0.7)
Eosinophils Relative: 10 % — ABNORMAL HIGH (ref 0–5)
HEMATOCRIT: 34.9 % — AB (ref 36.0–46.0)
HEMOGLOBIN: 11.6 g/dL — AB (ref 12.0–15.0)
LYMPHS ABS: 0.6 10*3/uL — AB (ref 0.7–4.0)
LYMPHS PCT: 18 % (ref 12–46)
MCH: 31.4 pg (ref 26.0–34.0)
MCHC: 33.2 g/dL (ref 30.0–36.0)
MCV: 94.3 fL (ref 78.0–100.0)
MONO ABS: 0.4 10*3/uL (ref 0.1–1.0)
MONOS PCT: 12 % (ref 3–12)
MPV: 9.9 fL (ref 8.6–12.4)
NEUTROS ABS: 2 10*3/uL (ref 1.7–7.7)
Neutrophils Relative %: 59 % (ref 43–77)
Platelets: 226 10*3/uL (ref 150–400)
RBC: 3.7 MIL/uL — AB (ref 3.87–5.11)
RDW: 13.4 % (ref 11.5–15.5)
WBC: 3.4 10*3/uL — ABNORMAL LOW (ref 4.0–10.5)

## 2015-11-28 LAB — BASIC METABOLIC PANEL
BUN: 7 mg/dL (ref 7–25)
CO2: 28 mmol/L (ref 20–31)
CREATININE: 0.84 mg/dL (ref 0.50–1.10)
Calcium: 9 mg/dL (ref 8.6–10.2)
Chloride: 102 mmol/L (ref 98–110)
GLUCOSE: 83 mg/dL (ref 65–99)
POTASSIUM: 3.8 mmol/L (ref 3.5–5.3)
Sodium: 138 mmol/L (ref 135–146)

## 2015-11-29 LAB — T-HELPER CELL (CD4) - (RCID CLINIC ONLY)
CD4 % Helper T Cell: 35 % (ref 33–55)
CD4 T Cell Abs: 290 /uL — ABNORMAL LOW (ref 400–2700)

## 2015-11-29 LAB — HIV-1 RNA QUANT-NO REFLEX-BLD

## 2015-12-03 ENCOUNTER — Other Ambulatory Visit: Payer: Self-pay | Admitting: Internal Medicine

## 2015-12-03 DIAGNOSIS — B20 Human immunodeficiency virus [HIV] disease: Secondary | ICD-10-CM

## 2015-12-12 ENCOUNTER — Ambulatory Visit: Payer: Self-pay | Admitting: Internal Medicine

## 2015-12-12 ENCOUNTER — Other Ambulatory Visit: Payer: Self-pay

## 2016-01-18 ENCOUNTER — Other Ambulatory Visit: Payer: Self-pay | Admitting: *Deleted

## 2016-01-18 DIAGNOSIS — A6000 Herpesviral infection of urogenital system, unspecified: Secondary | ICD-10-CM

## 2016-01-18 MED ORDER — VALACYCLOVIR HCL 1 G PO TABS: 1000.0000 mg | ORAL_TABLET | Freq: Every day | ORAL | Status: DC

## 2016-02-07 ENCOUNTER — Ambulatory Visit: Payer: Self-pay | Admitting: Internal Medicine

## 2016-03-08 ENCOUNTER — Other Ambulatory Visit (INDEPENDENT_AMBULATORY_CARE_PROVIDER_SITE_OTHER): Payer: Self-pay

## 2016-03-08 DIAGNOSIS — Z79899 Other long term (current) drug therapy: Secondary | ICD-10-CM

## 2016-03-08 DIAGNOSIS — Z113 Encounter for screening for infections with a predominantly sexual mode of transmission: Secondary | ICD-10-CM

## 2016-03-08 DIAGNOSIS — B20 Human immunodeficiency virus [HIV] disease: Secondary | ICD-10-CM

## 2016-03-08 LAB — CBC
HEMATOCRIT: 38.2 % (ref 35.0–45.0)
HEMOGLOBIN: 13.4 g/dL (ref 11.7–15.5)
MCH: 32.7 pg (ref 27.0–33.0)
MCHC: 35.1 g/dL (ref 32.0–36.0)
MCV: 93.2 fL (ref 80.0–100.0)
MPV: 10.3 fL (ref 7.5–12.5)
Platelets: 255 10*3/uL (ref 140–400)
RBC: 4.1 MIL/uL (ref 3.80–5.10)
RDW: 14.4 % (ref 11.0–15.0)
WBC: 13.7 10*3/uL — AB (ref 3.8–10.8)

## 2016-03-08 LAB — LIPID PANEL
CHOL/HDL RATIO: 3.4 ratio (ref <=5.0)
CHOLESTEROL: 216 mg/dL — AB (ref 125–200)
HDL: 63 mg/dL (ref >=46)
LDL Cholesterol: 140 mg/dL — ABNORMAL HIGH (ref <130)
TRIGLYCERIDES: 65 mg/dL (ref <150)
VLDL: 13 mg/dL (ref <30)

## 2016-03-08 NOTE — Addendum Note (Signed)
Addended by: Mariea ClontsGREEN, Adalina Dopson D on: 03/08/2016 10:36 AM   Modules accepted: Orders

## 2016-03-09 LAB — T-HELPER CELL (CD4) - (RCID CLINIC ONLY)
CD4 % Helper T Cell: 29 % — ABNORMAL LOW (ref 33–55)
CD4 T Cell Abs: 520 /uL (ref 400–2700)

## 2016-03-09 LAB — URINE CYTOLOGY ANCILLARY ONLY
CHLAMYDIA, DNA PROBE: NEGATIVE
NEISSERIA GONORRHEA: NEGATIVE

## 2016-03-09 LAB — HIV-1 RNA QUANT-NO REFLEX-BLD: HIV 1 RNA Quant: <20 copies/mL (ref <20)

## 2016-04-12 ENCOUNTER — Ambulatory Visit (INDEPENDENT_AMBULATORY_CARE_PROVIDER_SITE_OTHER): Payer: Self-pay | Admitting: Internal Medicine

## 2016-04-12 ENCOUNTER — Encounter: Payer: Self-pay | Admitting: Internal Medicine

## 2016-04-12 ENCOUNTER — Ambulatory Visit: Payer: Self-pay

## 2016-04-12 ENCOUNTER — Ambulatory Visit: Payer: Self-pay | Admitting: *Deleted

## 2016-04-12 VITALS — BP 145/94 | HR 55 | Temp 98.5°F | Wt 167.0 lb

## 2016-04-12 DIAGNOSIS — F1990 Other psychoactive substance use, unspecified, uncomplicated: Secondary | ICD-10-CM

## 2016-04-12 DIAGNOSIS — R87610 Atypical squamous cells of undetermined significance on cytologic smear of cervix (ASC-US): Secondary | ICD-10-CM

## 2016-04-12 DIAGNOSIS — A6000 Herpesviral infection of urogenital system, unspecified: Secondary | ICD-10-CM

## 2016-04-12 DIAGNOSIS — B20 Human immunodeficiency virus [HIV] disease: Secondary | ICD-10-CM

## 2016-04-12 MED ORDER — EMTRICITAB-RILPIVIR-TENOFOV AF 200-25-25 MG PO TABS: 1.0000 | ORAL_TABLET | Freq: Every day | ORAL | Status: DC

## 2016-04-12 NOTE — BH Specialist Note (Signed)
Counselor met with Tiffany Morrison in the exam room per Dr. Baxter Flattery request as a warm hand off due to patient relapsing and having a lot of drama taking place in her life right now. Patient was oriented times four with good affect but different dress than usual.  Patient was wearing no make up, hair not fixed and put up in a bun, wearing shorts and T-shirt.  Patient has always been immaculate and very "dolled" up with nails and hair in place dressed up with fancy clothing.  Today patient was very casual.  Patient shared freely today with counselor and would tear up off and on throughout the conversation.  Patient reported a serious relapse that went on for 2 months as a result of her finace postponing their wedding. Patient drifted from smiling to tearing up constantly.  Patient shared that she is living in [place] right now and at this time has no plans to return.  Patient agreed to stay on top of her medications and treatment.  Counselor provided support and encouragement for patient.  Counselor also recommended that patient seek out NA meetings as well as outpatient substance abuse treatment close to her residence in [place].  Patient stated that she had been clean for 6 weeks and did not feel like that was necessary right now.   Rolena Infante, MA, LPC [facility]

## 2016-04-12 NOTE — Progress Notes (Signed)
Patient ID: Tiffany Morrison, female   DOB: [birth year], 30 y.o.   MRN: [id number]       Patient ID: Tiffany Morrison, female   DOB: [birth year], 30 y.o.   MRN: [id number]  HPI 30yo F with HIV disease, CD 4 count, 520/VL<20. Currently on odefsey. Needs refills. She is now between here and [place], [place]. Recently relapsed to using cocaine but has stayed clean for 2 months since staying with friends in [place]. 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 She states that her relapse also prompted breaking up her 10 year relationship and engagement. She is getting used to being single. Not currently dating anyone. Denies situation depression, though she knows that she prefers to on her own. Denies any thoughts of harm to self or others.  Outpatient Encounter Prescriptions as of 04/12/2016  Medication Sig  . emtricitabine-rilpivir-tenofovir AF (ODEFSEY) 200-25-25 MG TABS tablet Take 1 tablet by mouth daily. With meal.  . naproxen (NAPROSYN) 250 MG tablet Take by mouth 2 (two) times daily with a meal.  . valACYclovir (VALTREX) 1000 MG tablet Take 1 tablet (1,000 mg total) by mouth daily.   No facility-administered encounter medications on file as of 04/12/2016.     Patient Active Problem List   Diagnosis Date Noted  . Migraine headache 10/13/2013  . 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 Viral warts 12/25/2011  . Abnormal Pap smear of cervix 10/09/2011  . HIV infection 10/09/2011  . Genital herpes 09/14/2011     Health Maintenance Due  Topic Date Due  . TETANUS/TDAP  03/11/2003     Review of Systems Per hpi. Otherwise 10 point ros is negative Physical Exam   BP 145/94 mmHg  Pulse 55  Temp(Src) 98.5 F (36.9 C) (Oral)  Wt 167 lb (75.751 kg)  LMP 03/21/2016 Physical Exam  Constitutional:  oriented to person, place, and time. appears well-developed and well-nourished. No distress.  HENT: [place]/AT, PERRLA, no scleral icterus Mouth/Throat: Oropharynx is clear and moist. No  oropharyngeal exudate.  Cardiovascular: Normal rate, regular rhythm and normal heart sounds. Exam reveals no gallop and no friction rub.  No murmur heard.  Pulmonary/Chest: Effort normal and breath sounds normal. No respiratory distress.  has no wheezes.  Neck = supple, no nuchal rigidity Lymphadenopathy: no cervical adenopathy. No axillary adenopathy Skin: Skin is warm and dry. No rash noted. No erythema.  Psychiatric: a normal mood and affect.  behavior is normal.   Lab Results  Component Value Date   CD4TCELL 29* 03/08/2016   Lab Results  Component Value Date   CD4TABS 520 03/08/2016   CD4TABS 290* 11/28/2015   CD4TABS 480 07/04/2015   Lab Results  Component Value Date   HIV1RNAQUANT <20 03/08/2016   Lab Results  Component Value Date   HEPBSAB POS* 10/04/2011   No results found for: RPR  CBC Lab Results  Component Value Date   WBC 13.7* 03/08/2016   RBC 4.10 03/08/2016   HGB 13.4 03/08/2016   HCT 38.2 03/08/2016   PLT 255 03/08/2016   MCV 93.2 03/08/2016   MCH 32.7 03/08/2016   MCHC 35.1 03/08/2016   RDW 14.4 03/08/2016   LYMPHSABS 0.6* 11/28/2015   MONOABS 0.4 11/28/2015   EOSABS 0.3 11/28/2015   BASOSABS 0.0 11/28/2015   BMET Lab Results  Component Value Date   NA 138 11/28/2015   K 3.8 11/28/2015   CL 102 11/28/2015   CO2 28 11/28/2015   GLUCOSE 83 11/28/2015   BUN 7 11/28/2015  CREATININE 0.84 11/28/2015   CALCIUM 9.0 11/28/2015   GFRNONAA >89 07/04/2015   GFRAA >89 07/04/2015     Assessment and Plan  hiv disease = well-controlled, we will give refill for odefsey  Drug use/relapse = appears due to stress since no longer with her 10 year relationship. Meeting with jodie today.  Health maintenance/ hx of ASCUS= getting pap for surveillance  Hx of genital herpes = continue on valtrex suppression

## 2016-04-13 ENCOUNTER — Ambulatory Visit: Payer: Self-pay

## 2016-04-17 ENCOUNTER — Encounter: Payer: Self-pay | Admitting: Internal Medicine

## 2016-07-06 ENCOUNTER — Ambulatory Visit (INDEPENDENT_AMBULATORY_CARE_PROVIDER_SITE_OTHER): Payer: Self-pay | Admitting: *Deleted

## 2016-07-06 ENCOUNTER — Other Ambulatory Visit (INDEPENDENT_AMBULATORY_CARE_PROVIDER_SITE_OTHER): Payer: Self-pay

## 2016-07-06 ENCOUNTER — Encounter: Payer: Self-pay | Admitting: *Deleted

## 2016-07-06 DIAGNOSIS — Z113 Encounter for screening for infections with a predominantly sexual mode of transmission: Secondary | ICD-10-CM

## 2016-07-06 DIAGNOSIS — B20 Human immunodeficiency virus [HIV] disease: Secondary | ICD-10-CM

## 2016-07-06 DIAGNOSIS — Z124 Encounter for screening for malignant neoplasm of cervix: Secondary | ICD-10-CM

## 2016-07-06 DIAGNOSIS — Z79899 Other long term (current) drug therapy: Secondary | ICD-10-CM

## 2016-07-06 LAB — CBC WITH DIFFERENTIAL/PLATELET
BASOS PCT: 0 %
Basophils Absolute: 0 cells/uL (ref 0–200)
Eosinophils Absolute: 690 cells/uL — ABNORMAL HIGH (ref 15–500)
Eosinophils Relative: 10 %
HCT: 44 % (ref 35.0–45.0)
Hemoglobin: 15 g/dL (ref 11.7–15.5)
LYMPHS PCT: 28 %
Lymphs Abs: 1932 cells/uL (ref 850–3900)
MCH: 33 pg (ref 27.0–33.0)
MCHC: 34.1 g/dL (ref 32.0–36.0)
MCV: 96.7 fL (ref 80.0–100.0)
MONOS PCT: 8 %
MPV: 10.3 fL (ref 7.5–12.5)
Monocytes Absolute: 552 cells/uL (ref 200–950)
NEUTROS ABS: 3726 {cells}/uL (ref 1500–7800)
Neutrophils Relative %: 54 %
PLATELETS: 275 10*3/uL (ref 140–400)
RBC: 4.55 MIL/uL (ref 3.80–5.10)
RDW: 14.5 % (ref 11.0–15.0)
WBC: 6.9 10*3/uL (ref 3.8–10.8)

## 2016-07-06 LAB — COMPREHENSIVE METABOLIC PANEL
ALK PHOS: 52 U/L (ref 33–115)
ALT: 11 U/L (ref 6–29)
AST: 17 U/L (ref 10–30)
Albumin: 4.3 g/dL (ref 3.6–5.1)
BILIRUBIN TOTAL: 1.1 mg/dL (ref 0.2–1.2)
BUN: 8 mg/dL (ref 7–25)
CO2: 25 mmol/L (ref 20–31)
CREATININE: 0.98 mg/dL (ref 0.50–1.10)
Calcium: 9.4 mg/dL (ref 8.6–10.2)
Chloride: 102 mmol/L (ref 98–110)
GLUCOSE: 87 mg/dL (ref 65–99)
Potassium: 3.7 mmol/L (ref 3.5–5.3)
SODIUM: 137 mmol/L (ref 135–146)
Total Protein: 8.1 g/dL (ref 6.1–8.1)

## 2016-07-06 LAB — LIPID PANEL
CHOL/HDL RATIO: 5.5 ratio — AB (ref <=5.0)
Cholesterol: 216 mg/dL — ABNORMAL HIGH (ref 125–200)
HDL: 39 mg/dL — ABNORMAL LOW (ref >=46)
LDL Cholesterol: 159 mg/dL — ABNORMAL HIGH (ref <130)
Triglycerides: 90 mg/dL (ref <150)
VLDL: 18 mg/dL (ref <30)

## 2016-07-06 LAB — T-HELPER CELL (CD4) - (RCID CLINIC ONLY)
CD4 % Helper T Cell: 22 % — ABNORMAL LOW (ref 33–55)
CD4 T Cell Abs: 440 /uL (ref 400–2700)

## 2016-07-06 NOTE — Patient Instructions (Signed)
Your results will be ready in about a week.  I will send them to you.  If you require treatment I will contact you.  Thank you for coming to the Center for your care.  Angelique Blonderenise, RN

## 2016-07-06 NOTE — Progress Notes (Signed)
Subjective:     Tiffany Morrison is a 30 y.o. woman who comes in today for a  pap smear only.  Previous abnormal Pap smears: yes. Contraception: condoms.  Complained of odiferous, yellow, vaginal discharge for two weeks.   There were no vitals taken for this visit. Pelvic Exam:  Pap smear obtained.   Assessment:    Screening pap smear.   Plan:    Follow up in one, or as indicated by Pap results.  Pt given educational materials re: HIV and women, self-esteem, BSE, nutrition and diet management, PAP smears and partner safety. Pt given condoms

## 2016-07-09 LAB — HIV-1 RNA QUANT-NO REFLEX-BLD

## 2016-07-09 LAB — CERVICOVAGINAL ANCILLARY ONLY
Chlamydia: NEGATIVE
NEISSERIA GONORRHEA: NEGATIVE
TRICH (WINDOWPATH): NEGATIVE

## 2016-07-10 LAB — CYTOLOGY - PAP

## 2016-08-08 ENCOUNTER — Ambulatory Visit: Payer: Self-pay | Admitting: Internal Medicine

## 2016-08-08 NOTE — Progress Notes (Signed)
Counselor did not see patient This encounter was created in error - please disregard.  This encounter was created in error - please disregard.

## 2016-08-08 NOTE — Progress Notes (Signed)
Patient was a no show.   This encounter was created in error - please disregard. 

## 2016-11-14 ENCOUNTER — Other Ambulatory Visit: Payer: Self-pay

## 2016-11-14 DIAGNOSIS — B20 Human immunodeficiency virus [HIV] disease: Secondary | ICD-10-CM

## 2016-11-14 DIAGNOSIS — Z113 Encounter for screening for infections with a predominantly sexual mode of transmission: Secondary | ICD-10-CM

## 2016-11-14 LAB — COMPREHENSIVE METABOLIC PANEL
ALT: 8 U/L (ref 6–29)
AST: 13 U/L (ref 10–30)
Albumin: 3.7 g/dL (ref 3.6–5.1)
Alkaline Phosphatase: 59 U/L (ref 33–115)
BUN: 12 mg/dL (ref 7–25)
CALCIUM: 8.9 mg/dL (ref 8.6–10.2)
CO2: 28 mmol/L (ref 20–31)
Chloride: 105 mmol/L (ref 98–110)
Creat: 0.97 mg/dL (ref 0.50–1.10)
Glucose, Bld: 80 mg/dL (ref 65–99)
POTASSIUM: 3.8 mmol/L (ref 3.5–5.3)
Sodium: 141 mmol/L (ref 135–146)
TOTAL PROTEIN: 6.8 g/dL (ref 6.1–8.1)
Total Bilirubin: 0.3 mg/dL (ref 0.2–1.2)

## 2016-11-14 LAB — CBC WITH DIFFERENTIAL/PLATELET
BASOS PCT: 1 %
Basophils Absolute: 66 cells/uL (ref 0–200)
EOS ABS: 660 {cells}/uL — AB (ref 15–500)
Eosinophils Relative: 10 %
HEMATOCRIT: 39 % (ref 35.0–45.0)
HEMOGLOBIN: 12.6 g/dL (ref 11.7–15.5)
LYMPHS ABS: 2376 {cells}/uL (ref 850–3900)
LYMPHS PCT: 36 %
MCH: 32.3 pg (ref 27.0–33.0)
MCHC: 32.3 g/dL (ref 32.0–36.0)
MCV: 100 fL (ref 80.0–100.0)
MONO ABS: 330 {cells}/uL (ref 200–950)
MPV: 10.1 fL (ref 7.5–12.5)
Monocytes Relative: 5 %
NEUTROS ABS: 3168 {cells}/uL (ref 1500–7800)
Neutrophils Relative %: 48 %
Platelets: 245 10*3/uL (ref 140–400)
RBC: 3.9 MIL/uL (ref 3.80–5.10)
RDW: 14.4 % (ref 11.0–15.0)
WBC: 6.6 10*3/uL (ref 3.8–10.8)

## 2016-11-15 ENCOUNTER — Encounter: Payer: Self-pay | Admitting: Internal Medicine

## 2016-11-15 LAB — T-HELPER CELL (CD4) - (RCID CLINIC ONLY)
CD4 % Helper T Cell: 28 % — ABNORMAL LOW (ref 33–55)
CD4 T Cell Abs: 700 /uL (ref 400–2700)

## 2016-11-15 LAB — RPR

## 2016-11-21 LAB — HIV-1 RNA QUANT-NO REFLEX-BLD
HIV 1 RNA QUANT: 27 {copies}/mL — AB
HIV-1 RNA Quant, Log: 1.43 Log copies/mL — ABNORMAL HIGH

## 2017-02-08 ENCOUNTER — Other Ambulatory Visit: Payer: Self-pay | Admitting: Internal Medicine

## 2017-02-08 DIAGNOSIS — A6000 Herpesviral infection of urogenital system, unspecified: Secondary | ICD-10-CM

## 2017-02-10 ENCOUNTER — Other Ambulatory Visit: Payer: Self-pay | Admitting: Internal Medicine

## 2017-02-10 DIAGNOSIS — A6000 Herpesviral infection of urogenital system, unspecified: Secondary | ICD-10-CM

## 2017-02-11 ENCOUNTER — Ambulatory Visit: Payer: Self-pay | Admitting: Internal Medicine

## 2017-03-11 ENCOUNTER — Ambulatory Visit: Payer: Self-pay | Admitting: Internal Medicine

## 2017-03-14 ENCOUNTER — Ambulatory Visit: Payer: Self-pay | Admitting: Internal Medicine

## 2017-03-15 ENCOUNTER — Other Ambulatory Visit: Payer: Self-pay | Admitting: Internal Medicine

## 2017-03-15 DIAGNOSIS — A6000 Herpesviral infection of urogenital system, unspecified: Secondary | ICD-10-CM

## 2017-04-01 ENCOUNTER — Encounter: Payer: Self-pay | Admitting: Internal Medicine

## 2017-04-01 ENCOUNTER — Other Ambulatory Visit: Payer: Self-pay | Admitting: Internal Medicine

## 2017-04-01 ENCOUNTER — Ambulatory Visit (INDEPENDENT_AMBULATORY_CARE_PROVIDER_SITE_OTHER): Payer: Self-pay | Admitting: Internal Medicine

## 2017-04-01 VITALS — BP 164/95 | HR 54 | Temp 98.3°F | Wt 156.0 lb

## 2017-04-01 DIAGNOSIS — F1991 Other psychoactive substance use, unspecified, in remission: Secondary | ICD-10-CM

## 2017-04-01 DIAGNOSIS — Z716 Tobacco abuse counseling: Secondary | ICD-10-CM

## 2017-04-01 DIAGNOSIS — B20 Human immunodeficiency virus [HIV] disease: Secondary | ICD-10-CM

## 2017-04-01 DIAGNOSIS — Z720 Tobacco use: Secondary | ICD-10-CM

## 2017-04-01 DIAGNOSIS — Z87898 Personal history of other specified conditions: Secondary | ICD-10-CM

## 2017-04-01 MED ORDER — BICTEGRAVIR-EMTRICITAB-TENOFOV 50-200-25 MG PO TABS: 1.0000 | ORAL_TABLET | Freq: Every day | ORAL | 11 refills | Status: DC

## 2017-04-01 NOTE — Progress Notes (Signed)
RFV: hiv disease follow up  Patient ID: Tiffany Morrison, female   DOB: November 19, 1985, 31 y.o.   MRN: [id number]  HPI  31yo F with HIV disease, CD 4 count of 700/VL 27 in Feb 2018 currently on odefsey. She is up for renewel for adap.last pap in oct 2017 was normal.she has been working in bar, mostly nights that has been giving her an irregular schedule for her to take her meds. She reports missing 2 doses per month, often late taking her meds.   She has increase in smoking but less illicit drug use. Interested in attending group counseling. Planning to relocate back up to [facility]  Outpatient Encounter Prescriptions as of 04/01/2017  Medication Sig  . emtricitabine-rilpivir-tenofovir AF (ODEFSEY) 200-25-25 MG TABS tablet Take 1 tablet by mouth daily. With meal.  . naproxen (NAPROSYN) 250 MG tablet Take by mouth 2 (two) times daily with a meal.  . valACYclovir (VALTREX) 1000 MG tablet TAKE 1 TABLET(1000 MG) BY MOUTH DAILY   No facility-administered encounter medications on file as of 04/01/2017.      Patient Active Problem List   Diagnosis Date Noted  . Migraine headache 10/13/2013  . 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 Viral warts 12/25/2011  . Abnormal Pap smear of cervix 10/09/2011  . HIV infection 10/09/2011  . Genital herpes 09/14/2011     Health Maintenance Due  Topic Date Due  . TETANUS/TDAP  03/11/2003     Review of Systems Review of Systems  Constitutional: Negative for fever, chills, diaphoresis, activity change, appetite change, fatigue and unexpected weight change.  HENT: Negative for congestion, sore throat, rhinorrhea, sneezing, trouble swallowing and sinus pressure.  Eyes: Negative for photophobia and visual disturbance.  Respiratory: + for smoker'scough, occ chest tightness with wheezing, bt denies shortness of breath,  and stridor.  Cardiovascular: Negative for chest pain, palpitations and leg swelling.  Gastrointestinal:  Negative for nausea, vomiting, abdominal pain, diarrhea, constipation, blood in stool, abdominal distention and anal bleeding.  Genitourinary: Negative for dysuria, hematuria, flank pain and difficulty urinating.  Musculoskeletal: Negative for myalgias, back pain, joint swelling, arthralgias and gait problem.  Skin: Negative for color change, pallor, rash and wound.  Neurological: Negative for dizziness, tremors, weakness and light-headedness.  Hematological: Negative for adenopathy. Does not bruise/bleed easily.  Psychiatric/Behavioral: Negative for behavioral problems, confusion, sleep disturbance, dysphoric mood, decreased concentration and agitation.    Physical Exam   BP (!) 164/95   Pulse (!) 54   Temp 98.3 F (36.8 C) (Oral)   Wt 156 lb (70.8 kg)   LMP 03/18/2017   BMI 23.04 kg/m  Physical Exam  Constitutional:  oriented to person, place, and time. appears well-developed and well-nourished. No distress.  HENT: [place]/AT, PERRLA, no scleral icterus Mouth/Throat: Oropharynx is clear and moist. No oropharyngeal exudate.  Cardiovascular: Normal rate, regular rhythm and normal heart sounds. Exam reveals no gallop and no friction rub.  No murmur heard.  Pulmonary/Chest: Effort normal and breath sounds normal. No respiratory distress.  has no wheezes. +wheezing Neck = supple, no nuchal rigidity Abdominal: Soft. Bowel sounds are normal.  exhibits no distension. There is no tenderness.  Lymphadenopathy: no cervical adenopathy. No axillary adenopathy Neurological: alert and oriented to person, place, and time.  Skin: Skin is warm and dry. No rash noted. No erythema.  Psychiatric: a normal mood and affect.  behavior is normal.    Lab Results  Component Value Date   CD4TCELL 28 (L) 11/14/2016  Lab Results  Component Value Date   CD4TABS 700 11/14/2016   CD4TABS 440 07/06/2016   CD4TABS 520 03/08/2016   Lab Results  Component Value Date   HIV1RNAQUANT 27 (H) 11/14/2016   Lab  Results  Component Value Date   HEPBSAB POS (A) 10/04/2011   Lab Results  Component Value Date   LABRPR NON REAC 11/14/2016    CBC Lab Results  Component Value Date   WBC 6.6 11/14/2016   RBC 3.90 11/14/2016   HGB 12.6 11/14/2016   HCT 39.0 11/14/2016   PLT 245 11/14/2016   MCV 100.0 11/14/2016   MCH 32.3 11/14/2016   MCHC 32.3 11/14/2016   RDW 14.4 11/14/2016   LYMPHSABS 2,376 11/14/2016   MONOABS 330 11/14/2016   EOSABS 660 (H) 11/14/2016    BMET Lab Results  Component Value Date   NA 141 11/14/2016   K 3.8 11/14/2016   CL 105 11/14/2016   CO2 28 11/14/2016   GLUCOSE 80 11/14/2016   BUN 12 11/14/2016   CREATININE 0.97 11/14/2016   CALCIUM 8.9 11/14/2016   GFRNONAA >89 07/04/2015   GFRAA >89 07/04/2015      Assessment and Plan  hiv disease - will do labs concern that she is no longer virologically controlled. Will plan on switch to biktarvy. And also get genotype. Counseled on adherence  Smoking cessation = spent 10 min on smoking cessation counseling  hsv proph = continue on valtrex

## 2017-04-02 LAB — T-HELPER CELL (CD4) - (RCID CLINIC ONLY)
CD4 % Helper T Cell: 32 % — ABNORMAL LOW (ref 33–55)
CD4 T Cell Abs: 660 /uL (ref 400–2700)

## 2017-04-04 LAB — HIV-1 RNA,QN PCR W/REFLEX GENOTYPE
HIV-1 RNA, QN PCR: 1.61 Log cps/mL — ABNORMAL HIGH
HIV-1 RNA, QN PCR: 41 Copies/mL — ABNORMAL HIGH

## 2017-04-10 ENCOUNTER — Encounter: Payer: Self-pay | Admitting: Internal Medicine

## 2017-04-18 ENCOUNTER — Other Ambulatory Visit: Payer: Self-pay | Admitting: Internal Medicine

## 2017-04-18 DIAGNOSIS — A6000 Herpesviral infection of urogenital system, unspecified: Secondary | ICD-10-CM

## 2017-04-18 DIAGNOSIS — B20 Human immunodeficiency virus [HIV] disease: Secondary | ICD-10-CM

## 2017-05-18 ENCOUNTER — Other Ambulatory Visit: Payer: Self-pay | Admitting: Internal Medicine

## 2017-05-18 DIAGNOSIS — A6000 Herpesviral infection of urogenital system, unspecified: Secondary | ICD-10-CM

## 2017-10-23 ENCOUNTER — Other Ambulatory Visit: Payer: Self-pay | Admitting: Internal Medicine

## 2017-10-23 DIAGNOSIS — A6000 Herpesviral infection of urogenital system, unspecified: Secondary | ICD-10-CM

## 2017-11-07 ENCOUNTER — Ambulatory Visit: Payer: Self-pay | Admitting: Infectious Diseases

## 2017-11-07 ENCOUNTER — Other Ambulatory Visit: Payer: Self-pay

## 2017-11-07 ENCOUNTER — Other Ambulatory Visit: Payer: Self-pay | Admitting: *Deleted

## 2017-11-07 ENCOUNTER — Ambulatory Visit (INDEPENDENT_AMBULATORY_CARE_PROVIDER_SITE_OTHER): Payer: Self-pay | Admitting: Infectious Diseases

## 2017-11-07 ENCOUNTER — Encounter: Payer: Self-pay | Admitting: Infectious Diseases

## 2017-11-07 ENCOUNTER — Ambulatory Visit: Payer: Self-pay

## 2017-11-07 DIAGNOSIS — Z113 Encounter for screening for infections with a predominantly sexual mode of transmission: Secondary | ICD-10-CM

## 2017-11-07 DIAGNOSIS — B20 Human immunodeficiency virus [HIV] disease: Secondary | ICD-10-CM

## 2017-11-07 DIAGNOSIS — Z79899 Other long term (current) drug therapy: Secondary | ICD-10-CM

## 2017-11-07 DIAGNOSIS — Z124 Encounter for screening for malignant neoplasm of cervix: Secondary | ICD-10-CM

## 2017-11-07 NOTE — Progress Notes (Signed)
     Subjective:    Tiffany Morrison is a 32 y.o. [id number] AA female here for an annual pelvic exam and pap smear.  No LMP recorded.     Current GYN complaints or concerns: Concerned about yeast infection because she has had increased milky/cloudy discharge over the last 3-4 days. No odor, itching. One sexual partner and uses condoms most of the time.   Past Medical History:  Diagnosis Date  . Abnormal Pap smear and cervical HPV (human papillomavirus) 2011, 2015   colposcopy 2014(CIN-1), 2015   . Genital herpes 09/14/2011  . HIV disease (HCC)   . Recurrent boils     Gynecologic History G2P0020  No LMP recorded. Contraception: condoms and rhythm method Last Pap: 07/06/16. Results were: normal --> she has had a history of ASCUS/HPV requiring colposcopy and bx in the past.  Last Mammogram: not indicated   Review of Systems Patient denies any abdominal/pelvic pain, problems with bowel movements, urination or intercourse. Vaginal d/c as described above.   Objective:  There were no vitals taken for this visit. Physical Exam  Constitutional: Well developed, well nourished, no acute distress. She is alert and oriented x3.  Pelvic: External genitalia is normal in appearance. The vagina is normal in appearance. The cervix is bulbous and easily visualized. No CMT, large amount of milky discharge noted coated over cervix and vaginal canal. Bimanual exam reveals uterus that is felt to be normal size, shape, and contour. No adnexal masses or tenderness noted. Psych: She has a normal mood and affect.    Assessment:  Normal pelvic exam. Thin prep pap was obtained and sent for cytology, HPV and GC/C, trichomonas, candida and gardnerella today.   Plan:  Health Maintenance = Return in 1 year for annual pap screening unless indicated sooner.   Contraception Prevention Counseling = discussed that rhythm method where she avoids sexual intercourse around ovulation days is only at best 70% effective  in preventing unplanned pregnancy over the course of a year. Advised consistent condom use paired with this for better effect. She is not interested in implantables or any hormonal options.   Vaginal Discharge = consistency not c/w candida or trichomonas vaginitis on exam. Cervix is normal in appearance and not friable. No odor. Possible that this is just normal cervical mucus (she is expecting her cycle sometime in the next 10 days. We will call her with results.   HIV = Continue Biktarvy. F/U as scheduled with Dr. Drue SecondSnider for ongoing HIV care.   I spent 15 minutes with the patient including greater than 50% of time in face to face counsel of the patient re HIV, STDs, pregnancy prevention/contraception options and in coordination of their care.  Rexene AlbertsStephanie Roxene Alviar, MSN, NP-C [facility] 11/07/2017 10:19 AM

## 2017-11-08 ENCOUNTER — Other Ambulatory Visit: Payer: Self-pay | Admitting: Infectious Diseases

## 2017-11-08 LAB — CBC WITH DIFFERENTIAL/PLATELET
BASOS ABS: 70 {cells}/uL (ref 0–200)
Basophils Relative: 1.1 %
EOS PCT: 8.9 %
Eosinophils Absolute: 570 cells/uL — ABNORMAL HIGH (ref 15–500)
HEMATOCRIT: 37.2 % (ref 35.0–45.0)
Hemoglobin: 12.7 g/dL (ref 11.7–15.5)
LYMPHS ABS: 2035 {cells}/uL (ref 850–3900)
MCH: 32.4 pg (ref 27.0–33.0)
MCHC: 34.1 g/dL (ref 32.0–36.0)
MCV: 94.9 fL (ref 80.0–100.0)
MPV: 10.3 fL (ref 7.5–12.5)
Monocytes Relative: 6.3 %
NEUTROS ABS: 3322 {cells}/uL (ref 1500–7800)
Neutrophils Relative %: 51.9 %
Platelets: 233 10*3/uL (ref 140–400)
RBC: 3.92 10*6/uL (ref 3.80–5.10)
RDW: 12.7 % (ref 11.0–15.0)
Total Lymphocyte: 31.8 %
WBC mixed population: 403 cells/uL (ref 200–950)
WBC: 6.4 10*3/uL (ref 3.8–10.8)

## 2017-11-08 LAB — LIPID PANEL
Cholesterol: 181 mg/dL (ref <200)
HDL: 56 mg/dL (ref >50)
LDL Cholesterol (Calc): 108 mg/dL (calc) — ABNORMAL HIGH
NON-HDL CHOLESTEROL (CALC): 125 mg/dL (ref <130)
TRIGLYCERIDES: 80 mg/dL (ref <150)
Total CHOL/HDL Ratio: 3.2 (calc) (ref <5.0)

## 2017-11-08 LAB — COMPLETE METABOLIC PANEL WITH GFR
AG Ratio: 1.2 (calc) (ref 1.0–2.5)
ALT: 9 U/L (ref 6–29)
AST: 13 U/L (ref 10–30)
Albumin: 3.9 g/dL (ref 3.6–5.1)
Alkaline phosphatase (APISO): 58 U/L (ref 33–115)
BILIRUBIN TOTAL: 0.3 mg/dL (ref 0.2–1.2)
BUN: 12 mg/dL (ref 7–25)
CALCIUM: 9.6 mg/dL (ref 8.6–10.2)
CHLORIDE: 105 mmol/L (ref 98–110)
CO2: 28 mmol/L (ref 20–32)
Creat: 0.83 mg/dL (ref 0.50–1.10)
GFR, EST AFRICAN AMERICAN: 107 mL/min/{1.73_m2} (ref >=60)
GFR, Est Non African American: 93 mL/min/{1.73_m2} (ref >=60)
GLUCOSE: 78 mg/dL (ref 65–99)
Globulin: 3.3 g/dL (calc) (ref 1.9–3.7)
Potassium: 4.1 mmol/L (ref 3.5–5.3)
Sodium: 140 mmol/L (ref 135–146)
TOTAL PROTEIN: 7.2 g/dL (ref 6.1–8.1)

## 2017-11-08 LAB — CYTOLOGY - PAP
Adequacy: ABSENT
Bacterial vaginitis: POSITIVE — AB
CANDIDA VAGINITIS: NEGATIVE
Chlamydia: NEGATIVE
Diagnosis: NEGATIVE
Neisseria Gonorrhea: NEGATIVE
Trichomonas: NEGATIVE

## 2017-11-08 LAB — T-HELPER CELL (CD4) - (RCID CLINIC ONLY)
CD4 T CELL HELPER: 30 % — AB (ref 33–55)
CD4 T Cell Abs: 720 /uL (ref 400–2700)

## 2017-11-08 LAB — RPR: RPR: NONREACTIVE

## 2017-11-08 LAB — URINE CYTOLOGY ANCILLARY ONLY
CHLAMYDIA, DNA PROBE: NEGATIVE
Neisseria Gonorrhea: NEGATIVE

## 2017-11-08 MED ORDER — METRONIDAZOLE 500 MG PO TABS: 500.0000 mg | ORAL_TABLET | Freq: Two times a day (BID) | ORAL | 0 refills | Status: AC

## 2017-11-08 NOTE — Progress Notes (Signed)
Called with these results. She can reschedule her pap smear in 1 year. Advised no STI's only BV. Will send in Rx for flagyl x 7d. ETOH precautions provided.

## 2017-11-09 LAB — HIV-1 RNA QUANT-NO REFLEX-BLD
HIV 1 RNA QUANT: 35 {copies}/mL — AB
HIV-1 RNA QUANT, LOG: 1.54 {Log_copies}/mL — AB

## 2017-11-22 ENCOUNTER — Encounter: Payer: Self-pay | Admitting: Infectious Diseases

## 2018-05-21 ENCOUNTER — Other Ambulatory Visit: Payer: Self-pay | Admitting: Internal Medicine

## 2018-07-14 ENCOUNTER — Other Ambulatory Visit: Payer: Self-pay | Admitting: Internal Medicine

## 2018-07-14 ENCOUNTER — Telehealth: Payer: Self-pay

## 2018-07-14 DIAGNOSIS — A6000 Herpesviral infection of urogenital system, unspecified: Secondary | ICD-10-CM

## 2018-07-14 NOTE — Telephone Encounter (Signed)
Called patient to schedule follow- up appointment with Dr. Drue Second. Patient has not seen Dr. Drue Second since 2018. Last had labs done on 11/07/17. Patient was able to take my call and stated she is transferring care to [place]. Is getting refills from local office in [place], [place]. Will inform Dr. Drue Second patient has transferred care. Lorenso Courier, [place]
